# Patient Record
Sex: Male | Born: 1935 | Race: White | Hispanic: No | Smoking: Never smoker
Health system: Southern US, Community
[De-identification: ages and names within clinical notes are randomized; demographics above are authoritative.]

## PROBLEM LIST (undated history)

## (undated) DIAGNOSIS — I4510 Unspecified right bundle-branch block: Secondary | ICD-10-CM

## (undated) DIAGNOSIS — I2510 Atherosclerotic heart disease of native coronary artery without angina pectoris: Secondary | ICD-10-CM

## (undated) DIAGNOSIS — E785 Hyperlipidemia, unspecified: Secondary | ICD-10-CM

## (undated) DIAGNOSIS — Z950 Presence of cardiac pacemaker: Secondary | ICD-10-CM

## (undated) DIAGNOSIS — I495 Sick sinus syndrome: Secondary | ICD-10-CM

## (undated) DIAGNOSIS — I10 Essential (primary) hypertension: Secondary | ICD-10-CM

## (undated) HISTORY — DX: Unspecified right bundle-branch block: I45.10

## (undated) HISTORY — DX: Essential (primary) hypertension: I10

## (undated) HISTORY — DX: Sick sinus syndrome: I49.5

## (undated) HISTORY — PX: CATARACT EXTRACTION, BILATERAL: SHX1313

## (undated) HISTORY — DX: Hyperlipidemia, unspecified: E78.5

## (undated) HISTORY — DX: Atherosclerotic heart disease of native coronary artery without angina pectoris: I25.10

## (undated) HISTORY — PX: OTHER SURGICAL HISTORY: SHX169

---

## 1993-04-06 HISTORY — PX: HERNIA REPAIR: SHX51

## 1997-10-05 ENCOUNTER — Other Ambulatory Visit: Admission: RE | Admit: 1997-10-05 | Discharge: 1997-10-05 | Payer: Self-pay | Admitting: Cardiology

## 1997-12-22 ENCOUNTER — Observation Stay (HOSPITAL_COMMUNITY): Admission: EM | Admit: 1997-12-22 | Discharge: 1997-12-23 | Payer: Self-pay | Admitting: Emergency Medicine

## 2002-04-06 HISTORY — PX: HAND SURGERY: SHX662

## 2003-01-29 ENCOUNTER — Encounter: Admission: RE | Admit: 2003-01-29 | Discharge: 2003-01-29 | Payer: Self-pay | Admitting: Orthopedic Surgery

## 2003-01-29 ENCOUNTER — Encounter: Payer: Self-pay | Admitting: Orthopedic Surgery

## 2003-01-31 ENCOUNTER — Ambulatory Visit (HOSPITAL_BASED_OUTPATIENT_CLINIC_OR_DEPARTMENT_OTHER): Admission: RE | Admit: 2003-01-31 | Discharge: 2003-01-31 | Payer: Self-pay | Admitting: Orthopedic Surgery

## 2003-01-31 ENCOUNTER — Encounter (INDEPENDENT_AMBULATORY_CARE_PROVIDER_SITE_OTHER): Payer: Self-pay | Admitting: Specialist

## 2003-01-31 ENCOUNTER — Ambulatory Visit (HOSPITAL_COMMUNITY): Admission: RE | Admit: 2003-01-31 | Discharge: 2003-01-31 | Payer: Self-pay | Admitting: Orthopedic Surgery

## 2003-06-06 ENCOUNTER — Encounter (INDEPENDENT_AMBULATORY_CARE_PROVIDER_SITE_OTHER): Payer: Self-pay | Admitting: Specialist

## 2003-06-06 ENCOUNTER — Ambulatory Visit (HOSPITAL_COMMUNITY): Admission: RE | Admit: 2003-06-06 | Discharge: 2003-06-06 | Payer: Self-pay | Admitting: Gastroenterology

## 2003-12-04 ENCOUNTER — Encounter: Admission: RE | Admit: 2003-12-04 | Discharge: 2003-12-04 | Payer: Self-pay | Admitting: Orthopedic Surgery

## 2003-12-06 ENCOUNTER — Encounter (INDEPENDENT_AMBULATORY_CARE_PROVIDER_SITE_OTHER): Payer: Self-pay | Admitting: Specialist

## 2003-12-06 ENCOUNTER — Ambulatory Visit (HOSPITAL_COMMUNITY): Admission: RE | Admit: 2003-12-06 | Discharge: 2003-12-06 | Payer: Self-pay | Admitting: Orthopedic Surgery

## 2003-12-06 ENCOUNTER — Ambulatory Visit (HOSPITAL_BASED_OUTPATIENT_CLINIC_OR_DEPARTMENT_OTHER): Admission: RE | Admit: 2003-12-06 | Discharge: 2003-12-06 | Payer: Self-pay | Admitting: Orthopedic Surgery

## 2004-07-08 ENCOUNTER — Observation Stay (HOSPITAL_COMMUNITY): Admission: EM | Admit: 2004-07-08 | Discharge: 2004-07-09 | Payer: Self-pay | Admitting: Emergency Medicine
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
## 2006-06-23 ENCOUNTER — Inpatient Hospital Stay (HOSPITAL_COMMUNITY): Admission: AD | Admit: 2006-06-23 | Discharge: 2006-06-24 | Payer: Self-pay | Admitting: *Deleted

## 2006-06-23 HISTORY — PX: OTHER SURGICAL HISTORY: SHX169

## 2010-02-27 ENCOUNTER — Encounter: Payer: Self-pay | Admitting: Internal Medicine

## 2010-05-06 NOTE — Miscellaneous (Signed)
Summary: Device preload  Clinical Lists Changes  Observations: Added new observation of PPM INDICATN: Sick sinus syndrome (02/27/2010 11:34) Added new observation of MAGNET RTE: BOL 85 ERI 65 (02/27/2010 11:34) Added new observation of PPMLEADSTAT2: active (02/27/2010 11:34) Added new observation of PPMLEADSER2: [id number] (02/27/2010 11:34) Added new observation of PPMLEADMOD2: [id number]  (02/27/2010 11:34) Added new observation of PPMLEADDOI2: 06/23/2006  (02/27/2010 11:34) Added new observation of PPMLEADLOC2: RV  (02/27/2010 11:34) Added new observation of PPMLEADSTAT1: active  (02/27/2010 11:34) Added new observation of PPMLEADSER1: [id number]  (02/27/2010 11:34) Added new observation of PPMLEADMOD1: 5076  (02/27/2010 11:34) Added new observation of PPMLEADDOI1: 06/23/2006  (02/27/2010 11:34) Added new observation of PPMLEADLOC1: RA  (02/27/2010 11:34) Added new observation of PPM DOI: 06/23/2006  (02/27/2010 11:34) Added new observation of PPM SERL#: [id number]  (02/27/2010 11:34) Added new observation of PPM MODL#: [id number]  (02/27/2010 96:29) Added new observation of PACEMAKERMFG: Medtronic  (02/27/2010 11:34) Added new observation of PPM IMP MD: Charlynn Court  (02/27/2010 11:34) Added new observation of PPM REFER MD: Peter Swaziland, MD  (02/27/2010 11:34) Added new observation of PACEMAKER MD: Hillis Range, MD  (02/27/2010 11:34)      PPM Specifications Following MD:  Hillis Range, MD     Referring MD:  Peter Swaziland, MD PPM Vendor:  Medtronic     PPM Model Number:  ADDR01     PPM Serial Number:  [id number] PPM DOI:  06/23/2006     PPM Implanting MD:  Charlynn Court  Lead 1    Location: RA     DOI: 06/23/2006     Model #: [id number]     Serial #: [id number]     Status: active Lead 2    Location: RV     DOI: 06/23/2006     Model #: [id number]     Serial #: [id number]     Status: active  Magnet Response Rate:  BOL 85 ERI 65  Indications:  Sick sinus syndrome

## 2010-05-09 ENCOUNTER — Encounter: Payer: Self-pay | Admitting: Internal Medicine

## 2010-05-09 ENCOUNTER — Encounter (INDEPENDENT_AMBULATORY_CARE_PROVIDER_SITE_OTHER): Payer: Medicare Other | Admitting: Internal Medicine

## 2010-05-09 ENCOUNTER — Ambulatory Visit: Admit: 2010-05-09 | Payer: Self-pay | Admitting: Internal Medicine

## 2010-05-09 DIAGNOSIS — I495 Sick sinus syndrome: Secondary | ICD-10-CM

## 2010-05-09 DIAGNOSIS — I2510 Atherosclerotic heart disease of native coronary artery without angina pectoris: Secondary | ICD-10-CM | POA: Insufficient documentation

## 2010-05-09 DIAGNOSIS — I441 Atrioventricular block, second degree: Secondary | ICD-10-CM | POA: Insufficient documentation

## 2010-05-09 DIAGNOSIS — I10 Essential (primary) hypertension: Secondary | ICD-10-CM | POA: Insufficient documentation

## 2010-05-14 NOTE — Assessment & Plan Note (Signed)
Summary: pacer check/medtronic/gso card pt/jml   Vital Signs:  Patient profile:   77 year old male Height:      70 inches Weight:      184 pounds BMI:     26.50 Pulse rate:   64 / minute Pulse rhythm:   regular BP sitting:   136 / 78  (left arm) Cuff size:   regular  Vitals Entered By: Danielle Rankin, CMA (May 09, 2010 10:53 AM)  Visit Type:  Pacemaker check Referring Provider:  Dr Swaziland Primary Provider:  Jarome Matin  CC:  denies any cardiac complaints today.  History of Present Illness: Peter Reynolds is a pleasant 77 yo WM with a h/o intermittent mobitz II second degree AV block w/p PPM (MDT) by Dr Reyes Ivan 06/23/06 who presents today to establish care in the EP clinic.  He reports doing very well.  He remains active and walks one mile each day.  He denies symptoms of palpitations, chest pain, shortness of breath, orthopnea, PND, lower extremity edema, dizziness, presyncope, syncope, or neurologic sequela. The patient is tolerating medications without difficulties and is otherwise without complaint today.   Current Medications (verified): 1)  Vitamin E 400 Unit Caps (Vitamin E) .Marland Kitchen.. 1 Cap Once Daily 2)  Multivitamins   Tabs (Multiple Vitamin) .Marland Kitchen.. 1 Tab Once Daily 3)  Aspirin 81 Mg Tbec (Aspirin) .... Take One Tablet By Mouth Daily 4)  Lisinopril-Hydrochlorothiazide 20-12.5 Mg Tabs (Lisinopril-Hydrochlorothiazide) .Marland Kitchen.. 1 Tab Qam 5)  Diltiazem Hcl Cr 240 Mg Xr24h-Cap (Diltiazem Hcl) .Marland Kitchen.. 1 Cap Once Daily 6)  Lovastatin 40 Mg Tabs (Lovastatin) .Marland Kitchen.. 1 Tab At Bedtime 7)  Viagra 100 Mg Tabs (Sildenafil Citrate) .... 1/2 Tab As Needed 8)  Nitrostat 0.4 Mg Subl (Nitroglycerin) .Marland Kitchen.. 1 Tablet Under Tongue At Onset of Chest Pain; You May Repeat Every 5 Minutes For Up To 3 Doses.  Allergies (verified): No Known Drug Allergies  Past History:  Past Medical History:  1. History of Mobitz type II second degree AV block. s/ PPM  2. coronary artery disease. s/p CABG  3.  Hypertension.  4. Dyslipidemia. rheumatic fever at age 39  Past Surgical History:   Dual-chamber permanent pacemaker implant 2008   Coronary artery bypass grafting.  Excision of palmar fascia, right middle, right ring and right little fingers.  Family History: +CAD  Social History:   He denies smoking and drinking.  Lives in [place] and attends [employer]  Review of Systems       + poor hearing All systems are reviewed and negative except as listed in the HPI.   Physical Exam  General:  Well developed, well nourished, in no acute distress. Head:  normocephalic and atraumatic Eyes:  PERRLA/EOM intact; conjunctiva and lids normal. Mouth:  Teeth, gums and palate normal. Oral mucosa normal. Neck:  supple Chest Wall:  pacemaker pocket is well healed Lungs:  Clear bilaterally to auscultation and percussion. Heart:  Non-displaced PMI, chest non-tender; regular rate and rhythm, S1, S2 without murmurs, rubs or gallops. Carotid upstroke normal, no bruit. Normal abdominal aortic size, no bruits. Femorals normal pulses, no bruits. Pedals normal pulses. No edema, no varicosities. Abdomen:  Bowel sounds positive; abdomen soft and non-tender without masses, organomegaly, or hernias noted. No hepatosplenomegaly. Msk:  Back normal, normal gait. Muscle strength and tone normal. Extremities:  No clubbing or cyanosis. Neurologic:  Alert and oriented x 3. Skin:  Intact without lesions or rashes. Psych:  Normal affect.   Impression & Recommendations:  Problem # 1:  ATRIOVENTRICULAR BLOCK, 2ND DEGREE (ICD-426.13) normal pacemaker function as above  Problem # 2:  CAD (ICD-414.00) no symptoms of ischemia no changes today regular exercise encouraged  Problem # 3:  ESSENTIAL HYPERTENSION, BENIGN (ICD-401.1) stable salt restriction  Patient Instructions: 1)  Your physician wants you to follow-up in: 12 months with Dr Jacquiline Doe will receive a reminder letter in the  mail two months in advance. If you don't receive a letter, please call our office to schedule the follow-up appointment. 2)  [facility] transmission on 08/07/2010 3)  Your physician recommends that you continue on your current medications as directed. Please refer to the Current Medication list given to you today.      PPM Specifications Following MD:  Hillis Range, MD     Referring MD:  Peter Swaziland, MD PPM Vendor:  [employer]tronic     PPM Model Number:  ADDR01     PPM Serial Number:  [id number] H PPM DOI:  06/23/2006     PPM Implanting MD:  Charlynn Court  Lead 1    Location: RA     DOI: 06/23/2006     Model #: 5076     Serial #: [id number]     Status: active Lead 2    Location: RV     DOI: 06/23/2006     Model #: 8469     Serial #: [id number]     Status: active  Magnet Response Rate:  BOL 85 ERI 65  Indications:  Sick sinus syndrome   PPM Follow Up Battery Voltage:  2.78 V     Battery Est. Longevity:  6 yrs       PPM Device Measurements Atrium  Amplitude: 5.60 mV, Impedance: 421 ohms, Threshold: 0.50 V at 0.40 msec Right Ventricle  Amplitude: 15.68 mV, Impedance: 531 ohms, Threshold: 0.50 V at 0.40 msec  Episodes MS Episodes:  2     Percent Mode Switch:  <0.1%     Ventricular High Rate:  1     Atrial Pacing:  87.2%     Ventricular Pacing:  0.2%  Parameters Mode:  MVP     Lower Rate Limit:  60     Upper Rate Limit:  130 Paced AV Delay:  180     Sensed AV Delay:  150 Next Remote Date:  08/07/2010     Next Cardiology Appt Due:  05/08/2011 Tech Comments:  2 MODE SWITCHES--LESS THAN 1 MINUTE. 1 VHR EPISODE LASTING 6 BEATS. NORMAL DEVICE FUNCTION.  PT WALKS ABOUT 1 MILE PER 3-4 TIMES A WEEK.  PT FEELS GREAT W/ACTIVITY.  CHANGED RA OUTPUT FROM 1.5 TO 2.0 AND RV OUTPUT FROM 2.0 TO 2.5 V.  CARELINK 08-07-10 AND ROV IN 12 MTHS W/JA. Vella Kohler  May 09, 2010 11:15 AM MD Comments:  agree

## 2010-05-22 NOTE — Cardiovascular Report (Signed)
Summary: Office Visit   Office Visit   Imported By: Roderic Ovens 05/14/2010 10:15:17  _____________________________________________________________________  External Attachment:    Type:   Image     Comment:   External Document

## 2010-06-09 ENCOUNTER — Other Ambulatory Visit (HOSPITAL_BASED_OUTPATIENT_CLINIC_OR_DEPARTMENT_OTHER): Payer: Self-pay | Admitting: Internal Medicine

## 2010-06-11 ENCOUNTER — Ambulatory Visit
Admission: RE | Admit: 2010-06-11 | Discharge: 2010-06-11 | Disposition: A | Discharge status: Home / Self Care | Payer: Medicare Other | Source: Ambulatory Visit | Attending: Internal Medicine | Admitting: Internal Medicine

## 2010-08-14 ENCOUNTER — Encounter: Payer: Medicare Other | Admitting: *Deleted

## 2010-08-20 ENCOUNTER — Encounter: Payer: Self-pay | Admitting: *Deleted

## 2010-08-22 ENCOUNTER — Other Ambulatory Visit: Payer: Self-pay | Admitting: Internal Medicine

## 2010-08-22 ENCOUNTER — Ambulatory Visit (INDEPENDENT_AMBULATORY_CARE_PROVIDER_SITE_OTHER): Payer: Medicare Other | Admitting: *Deleted

## 2010-08-22 DIAGNOSIS — I441 Atrioventricular block, second degree: Secondary | ICD-10-CM
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
## 2010-08-22 NOTE — Op Note (Signed)
NAME:  Peter Reynolds, Peter Reynolds               ACCOUNT NO.:  [id number]   MEDICAL RECORD NO.:  [id number]                   PATIENT TYPE:  AMB   LOCATION:  ENDO                                 FACILITY:  [facility]   PHYSICIAN:  John C. Madilyn Fireman, M.D.                 DATE OF BIRTH:  1928/06/25   DATE OF PROCEDURE:  06/06/2003  DATE OF DISCHARGE:                                 OPERATIVE REPORT   PROCEDURE:  Colonoscopy.   INDICATIONS FOR PROCEDURE:  Average-risk colon cancer screening in a 77-year-  old patient with no prior screening.   DESCRIPTION OF PROCEDURE:  The patient was placed in the left lateral  decubitus position and placed on the pulse monitor with continuous low-flow  oxygen delivered by nasal cannula.  He was sedated with 50 mcg IV fentanyl  and 4 mg IV Versed.  The Olympus video colonoscope was inserted into the  rectum and advanced to the cecum, confirmed by transillumination of  McBurney's point and visualization of the ileocecal valve and appendiceal  orifice.  Prep was excellent.  Within the cecum, there was a 4 mm polyp that  was removed by hot biopsy.  In the ascending colon, a 6 mm polyp was  fulgurated by hot biopsy.  The remainder of the ascending as well as the  transverse, descending, and sigmoid colon appeared normal with no further  polyps, masses, diverticula, or other mucosal abnormalities.  Within the  distal rectum, there was a 1.2 cm pedunculated polyp about 5 cm from the  anal verge that was removed by snare and sent in a separate specimen  container.  The scope was then withdrawn and the patient returned to the  recovery room in stable condition.  He tolerated the procedure well and  there were no immediate complications.   IMPRESSION:  Cecal, ascending and rectal colon polyps.   PLAN:  Await histology to determine method and interval for future colon  screening.                                               John C. Madilyn Fireman, M.D.    JCH/MEDQ  D:   06/06/2003  T:  06/06/2003  Job:  [id number]   cc:   Peter M. Swaziland, M.D.  [address], [address]  [place], [place] [address]  Fax: [phone]

## 2010-08-22 NOTE — Op Note (Signed)
NAME:  Peter Reynolds, Peter Reynolds               ACCOUNT NO.:  [id number]   MEDICAL RECORD NO.:  [id number]                   PATIENT TYPE:  AMB   LOCATION:  DSC                                  FACILITY:  [facility]   PHYSICIAN:  Cindee Salt, M.D.                    DATE OF BIRTH:  08/24/1928   DATE OF PROCEDURE:  12/06/2003  DATE OF DISCHARGE:                                 OPERATIVE REPORT   PREOPERATIVE DIAGNOSIS:  Dupuytren's contracture, right middle, right ring  and right little fingers.   POSTOPERATIVE DIAGNOSIS:  Dupuytren's contracture, right middle, right ring  and right little fingers.   OPERATION PERFORMED:  Excision of palmar fascia, right middle, right ring  and right little fingers.   SURGEON:  Cindee Salt, M.D.   ASSISTANTCarolyne Fiscal.   ANESTHESIA:  General.   INDICATIONS FOR PROCEDURE:  The patient is a 77 year old male with a history  of Dupuytren's contracture.  He is admitted for excision.   DESCRIPTION OF PROCEDURE:  The patient was brought to the operating room  where a general anesthetic was carried out without difficulty.  He was  prepped using DuraPrep in supine position, right arm free.  The limb was  exsanguinated loosely with an Esmarch bandage.  Tourniquet placed high on  the arm was inflated to 250 mmHg.  This allowed blood vessels to remain  filled.  A volar Brunner incision was made primarily over the little finger,  dissecting over the space between the middle and ring to allow excision of  the palmar fascia cords to each of these.  With blunt and sharp dissection  this was carried down through subcutaneous tissue.  The palmar fascia was  lifted off the carpal retinaculum proximally.  This was then followed  distally.  Neurovascular structures were identified.  With blunt and sharp  dissection this was dissected free out to the level of the middle phalanx of  the little finger.  The entire specimen was sent to pathology.  A cord was  present from the  abductor digiti quinti.  This was excised and neurovascular  bundles were left intact.  The incision on the middle ring was then  deepened.  The cords to each of these two digits were then excised without  difficulty inserting on the base of the proximal phalanx.  Neurovascular  structures were protected on each.  The wound was then copiously irrigated  with saline.  Vessel loop drain placed in the depths of the wound and the  wound closed with interrupted 5-0 nylon sutures.  A sterile compressive  dressing and splint were applied.  On deflation of the tourniquet, all  fingers immediately pinked.  He was taken to the recovery room for  observation in satisfactory condition.  He is discharged to home to return  to the [facility] in one week on Vicodin.  Cindee Salt, M.D.    Angelique Blonder  D:  12/06/2003  T:  12/06/2003  Job:  [id number]

## 2010-08-22 NOTE — H&P (Signed)
NAME:  Peter Reynolds, Peter Reynolds NO.:  [id number]   MEDICAL RECORD NO.:  [id number]            PATIENT TYPE:   LOCATION:                                 FACILITY:   PHYSICIAN:  Elmore Guise., M.D.   DATE OF BIRTH:   DATE OF ADMISSION:  06/23/2006  DATE OF DISCHARGE:                              HISTORY & PHYSICAL   INDICATION FOR ADMISSION:  Second-degree AV block with new bundle branch  block with history of presyncope for pacemaker implant.   HISTORY OF PRESENT ILLNESS:  Peter Reynolds is a very pleasant 77-year-  old white male, past medical history of coronary artery disease,  hypertension, dyslipidemia, who is being referred for a pacemaker  implant.  The patient has had syncopal spells in the past, with his last  one being approximately 5 years ago.  Since that time he had been doing  okay, he would have occasional episodes of fatigue and palpitations.  He  presented for evaluation and stress testing today, and was found to have  Mobitz type 2 second-degree AV block with new right bundle branch.  He  is now referred to discuss pacemaker implant.  He denies any exertional  angina, no orthopnea, no PND, no recent fever or cough.  He seems to be  tolerating his current medications well.  Review of systems are  otherwise negative.   CURRENT MEDICATIONS:  1. Multivitamin once daily.  2. Aspirin 325 mg daily.  3. Lisinopril/hydrochlorothiazide 20/12.5 mg daily.  4. Lovastatin 40 mg daily.   ALLERGIES:  None.   FAMILY HISTORY:  Positive for heart disease and COPD.   SOCIAL HISTORY:  He is remarried for the last year and half.  No tobacco  and no alcohol.   PHYSICAL EXAMINATION:  His weight is 182 pounds, his blood pressure is  140/90, his heart rate is 56 with occasional irregularity.  GENERAL:  He is a very pleasant elderly white male, alert and oriented  x4, in no acute distress.  He does appear younger than stated age.  HEENT:  Normal.  NECK: Supple.  No  lymphadenopathy, 2+ carotids.  No JVD and no bruits.  LUNGS:  Clear.  HEART:  Regular with a normal S1,S2, 2/6 holosystolic murmur heard left  lower sternal border.  ABDOMEN:  Soft, nontender, nondistended.  EXTREMITIES:  Have no significant edema.   His blood work is pending at time of dictation.  His EKG was reviewed  and showed Mobitz type 2 2:1 AV block with heart rate in the low 40s  with right bundle branch block.   IMPRESSION:  1. History of syncope.  2. Now Mobitz type 2 second-degree AV block with new right bundle      branch block.  3. History of coronary artery disease and coronary artery bypass      grafting.   PLAN:  The patient will be admitted for pacemaker implant.  I have  discussed risks and benefits with him at length.  He will continue his  medications as listed.  He will have routine blood work done today.  The  patient does wish  to proceed with the procedure.      Elmore Guise., M.D.  Electronically Signed     TWK/MEDQ  D:  06/18/2006  T:  06/20/2006  Job:  [id number]
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
## 2010-08-22 NOTE — Op Note (Signed)
NAME:  Peter Reynolds, Peter Reynolds NO.:  [id number]   MEDICAL RECORD NO.:  [id number]                   PATIENT TYPE:  AMB   LOCATION:  DSC                                  FACILITY:  [facility]   PHYSICIAN:  Cindee Salt, M.D.                    DATE OF BIRTH:  [birth year]   DATE OF PROCEDURE:  01/31/2003  DATE OF DISCHARGE:                                 OPERATIVE REPORT   PREOPERATIVE DIAGNOSIS:  Dupuytren's' contracture left hand.   POSTOPERATIVE DIAGNOSIS:  Dupuytren's' contracture left hand.   PROCEDURE:  Excision of palmar fascia first web space, left middle, left  ring, and left little fingers.   SURGEON:  Cindee Salt, M.D.   ASSISTANTCarolyne Fiscal.   ANESTHESIA:  General.   INDICATIONS FOR PROCEDURE:  The patient is a 77 year old male with a history  of Dupuytren's contracture involving all but his index finger left hand.  He  is desirous of having this removed with flexion deformities present.   DESCRIPTION OF PROCEDURE:  The patient was brought to the operating room  where a general anesthesia was carried out without difficulty.  He was  prepped using Duraprep in the supine position with the left arm free.  The  limb was exsanguinated with an Esmarch bandage.  The tourniquet placed high  on the arm was inflated to 250 mmHg.  The first web space was addressed  initially.  A zigzag incision was made and carried down through the  subcutaneous tissue, the cord going to the thumb was excised protecting the  digital nerve.  The first web space cord was also removed protecting the  artery and nerve to the index finger.  The wound was irrigated and closed  with interrupted 5-0 nylon sutures.  A separate volar Brunner type incision  was made based primarily to the little finger with the greatest degree of  flexion contracture. Skin bridges were left and volar Brunner incisions were  made to the middle and ring.  Dissection was carried down through  subcutaneous  tissue.  Bleeders were electrocauterized.  Palmar fascia was  isolated over the carpal retinaculum.  This was transected proximally.  This  allowed it to be lifted distally.  With blunt and sharp dissection it was  dissected free, protecting the neurovascular bundles to each of the fingers.  The little finger cord proceeded out to the middle phalanx.  This was  removed in total protecting both radial and ulnar nerves.  There was any  significant contribution from the abductor Digi quinti.  The specimen was  sent to pathology.  A separate incision on the middle finger allowed removal  of the cord from proximally to distally again protecting neurovascular  bundles, it was inserted down to the proximal phalanx and was removed in  total and sent to pathology.  Both neurovascular bundles were intact.  The  ring finger was attended to  next, again this cord proceeded out to the  proximal phalanx, inserted at the A2 pulley.  With protection of the  neurovascular bundles, it was excised and also sent to pathology.  The  wounds were copiously irrigated with saline.  Vessel loop drains were placed  into each wound, brought out proximally.  The wounds were closed with  interrupted 5-0 nylon sutures.  A sterile  compressive dressing was applied.  The tourniquet deflated.  All fingers  immediately pink.  The splint was applied.  He was taken to the recovery  room for observation in satisfactory condition.  He is discharged home to  return to the [facility] in one week on Vicodin and Keflex.                                               Cindee Salt, M.D.    Angelique Blonder  D:  01/31/2003  T:  01/31/2003  Job:  [id number]

## 2010-08-22 NOTE — Discharge Summary (Signed)
NAME:  Peter Reynolds, Peter Reynolds NO.:  [id number]   MEDICAL RECORD NO.:  [id number]          PATIENT TYPE:  INP   LOCATION:  6525                         FACILITY:  [facility]   PHYSICIAN:  Elmore Guise., M.D.DATE OF BIRTH:  05-13-1928   DATE OF ADMISSION:  06/23/2006  DATE OF DISCHARGE:  06/24/2006                               DISCHARGE SUMMARY   DISCHARGE DIAGNOSES:  1. History of Mobitz type II second degree AV block.  2. Bradycardia.  3. Status post dual chamber permanent pacemaker implant.  4. History of coronary artery disease.  5. Hypertension.  6. Dyslipidemia.   HISTORY OF PRESENT ILLNESS:  Peter Reynolds is a very pleasant, 77-year-  old, white male who was found to have bradycardia and Mobitz type II  second degree AV block.  He was referred for pacemaker implant.   HOSPITAL COURSE:  The patient's hospital course was uncomplicated.  He  underwent dual chamber permanent pacemaker implant on June 23, 2006.  He had a Medtronic Adapta dual chamber pacemaker nd implanted.  His  postop course went smoothly.  On postop day #1, he did have a small  hematoma noted at the inferior portion of his pocket, however, no active  bleeding was noted.  The patient's chest x-ray showed normal position of  his atrial and ventricular leads.  He had no pneumothorax.  His  pacemaker was interrogated and functioned appropriately.  He had  thresholds of less than 0.5 volts in 0.5 ms with the atrial and  ventricular chambers.  His impedance and sensing were all stable.  He  will be discharged home today to continue the following medications:   1. Multivitamin once daily.  2. Aspirin once daily.  3. Lisinopril/HCTZ 20/12.5 mg daily.  4. Lovastatin 40 mg daily.  5. Tylenol Extra Strength 1-2 q.6 h. as needed for pain.   The patient was instructed to put cool compresses to his site 2 times  per day for the next 24 hours to limit any further swelling.  He was  also given an  instruction/restriction sheet regarding what he can and  what he cannot do secondary to his pacemaker implant.  He is not to pick  up any heavy items with his left arm for approximately 2 weeks or do any  strenuous activity with his left arm for 2 weeks.  He is to keep his  pacemaker site clean and dry, and Betadine his Steri-Strips daily for  the next 3 days.  He was notified to call the office should he have any  further problems.  He will follow up in 1 week for post hospital check  and wound check.      Elmore Guise., M.D.  Electronically Signed    TWK/MEDQ  D:  06/24/2006  T:  06/24/2006  Job:  [id number]

## 2010-08-22 NOTE — Op Note (Signed)
NAME:  Peter Reynolds, KISHI NO.:  [id number]   MEDICAL RECORD NO.:  [id number]          PATIENT TYPE:  INP   LOCATION:  2807                         FACILITY:  [facility]   PHYSICIAN:  Elmore Guise., M.D.DATE OF BIRTH:  1928-04-21   DATE OF PROCEDURE:  06/23/2006  DATE OF DISCHARGE:                               OPERATIVE REPORT   PROCEDURE:  Dual-chamber permanent pacemaker implant.   INDICATIONS FOR PROCEDURE:  Mobitz type II second-degree AV block. Known  history of coronary disease, with bradycardia.   HISTORY OF PRESENT ILLNESS:  [name] is a very pleasant 77-year-  old white male, past medical history of coronary artery disease and  hypertension, who presented for evaluation was found to have off-and-on  bradycardia with intermittent second-degree heart block (Mobitz type  II).  The patient is now referred for permanent pacemaker implant.   PROCEDURE DESCRIPTION:  The patient was brought to cardiac cath lab  after appropriate informed consent.  He was prepped and draped in  sterile fashion.  Approximately 20 cc of 1% lidocaine was used for local  anesthesia.  A 2-inches incision was made in the left deltopectoral  groove.  A subcutaneous pocket was then made with blunt and Bovie  dissection.  A venogram was performed showing the course of the left  axillary/subclavian vein.  The left axillary vein was then accessed in  two separate sticks under fluoroscopic guidance.  Two 7-French safety  sheaths were placed over the retained wires.  The ventricular lead was  then placed. The ventricular lead is a Medtronic active fixation, 5076-  58 cm, serial number [id number].  The following measurements were  obtained.  R waves measured 10 mV, with an impedance of 1437 ohms,  threshold of 0.4 V at 0.5 msec, with a current of 0.2  mA. 10-volt check  was negative.  The atrial lead was then placed. The atrial lead is a  Medtronic active fixation, 5076-52 cm, serial  number [id number].  The  following measurements were obtained.  P-waves measured 4.2 mV, with an  impedance of 632 ohms, threshold of 0.7 V at 0.5 milliseconds, current  of 0.9 mA.  10-volt check was negative.  The safety sheaths were peeled  from the leads.  Leads were then sewn into the pocket.  The pocket was  copiously irrigated with kanamycin solution.  The atrial and ventricular  leads were then identified and placed in the appropriate portions of a  Medtronic Adapta ADDRO1 generator, serial number [id number].  The  generator was sewn into the pocket.  The pocket was then closed with two  continuous layers of suture (2-0 Vicryl).  The patient tolerated the  procedure well and was transferred from the cardiac catheterization lab  in stable condition      Elmore Guise., M.D.  Electronically Signed     TWK/MEDQ  D:  06/23/2006  T:  06/23/2006  Job:  [id number]

## 2010-09-22 NOTE — Progress Notes (Signed)
Pacer remote check  

## 2010-10-10 ENCOUNTER — Encounter: Payer: Self-pay | Admitting: *Deleted

## 2010-11-20 ENCOUNTER — Encounter: Payer: Medicare Other | Admitting: *Deleted

## 2010-12-25 ENCOUNTER — Encounter: Payer: Self-pay | Admitting: Internal Medicine

## 2010-12-25 ENCOUNTER — Ambulatory Visit (INDEPENDENT_AMBULATORY_CARE_PROVIDER_SITE_OTHER): Payer: Medicare Other | Admitting: *Deleted

## 2010-12-25 ENCOUNTER — Other Ambulatory Visit: Payer: Self-pay

## 2010-12-25 DIAGNOSIS — I495 Sick sinus syndrome: Secondary | ICD-10-CM

## 2010-12-27 LAB — REMOTE PACEMAKER DEVICE
ATRIAL PACING PM: 86
BAMS-0001: 175 {beats}/min
RV LEAD THRESHOLD: 0.75 V

## 2011-01-02 NOTE — Progress Notes (Signed)
Pacer checked by remote 

## 2011-01-05 ENCOUNTER — Encounter: Payer: Self-pay | Admitting: *Deleted

## 2011-03-26 ENCOUNTER — Other Ambulatory Visit: Payer: Self-pay | Admitting: Internal Medicine

## 2011-03-26 ENCOUNTER — Ambulatory Visit (INDEPENDENT_AMBULATORY_CARE_PROVIDER_SITE_OTHER): Payer: Medicare Other | Admitting: *Deleted

## 2011-03-26 ENCOUNTER — Encounter: Payer: Self-pay | Admitting: Internal Medicine

## 2011-03-26 DIAGNOSIS — I441 Atrioventricular block, second degree: Secondary | ICD-10-CM

## 2011-04-02 LAB — REMOTE PACEMAKER DEVICE
AL THRESHOLD: 0.625 V
ATRIAL PACING PM: 86
BATTERY VOLTAGE: 2.77 V
RV LEAD AMPLITUDE: 22.4 mv
RV LEAD IMPEDENCE PM: 547 Ohm
VENTRICULAR PACING PM: 0

## 2011-04-03 NOTE — Progress Notes (Signed)
PPM remote 

## 2011-04-08 ENCOUNTER — Encounter: Payer: Self-pay | Admitting: *Deleted

## 2011-05-08 ENCOUNTER — Encounter: Payer: Self-pay | Admitting: *Deleted

## 2011-05-11 ENCOUNTER — Encounter: Payer: Self-pay | Admitting: Internal Medicine

## 2011-05-11 ENCOUNTER — Ambulatory Visit (INDEPENDENT_AMBULATORY_CARE_PROVIDER_SITE_OTHER): Payer: Medicare Other | Admitting: Internal Medicine

## 2011-05-11 VITALS — BP 136/70 | HR 78 | Resp 18 | Ht 70.0 in | Wt 180.1 lb

## 2011-05-11 DIAGNOSIS — I441 Atrioventricular block, second degree: Secondary | ICD-10-CM

## 2011-05-11 DIAGNOSIS — I2510 Atherosclerotic heart disease of native coronary artery without angina pectoris: Secondary | ICD-10-CM

## 2011-05-11 DIAGNOSIS — I10 Essential (primary) hypertension: Secondary | ICD-10-CM

## 2011-05-11 DIAGNOSIS — I4891 Unspecified atrial fibrillation: Secondary | ICD-10-CM

## 2011-05-11 MED ORDER — DILTIAZEM HCL ER COATED BEADS 360 MG PO CP24: 360.0000 mg | ORAL_CAPSULE | Freq: Every day | ORAL | Status: DC

## 2011-05-11 NOTE — Progress Notes (Signed)
PCP:  Dr Dossie Arbour Primary Cardiologist:  Dr [place]  The patient presents today for routine electrophysiology followup.  Since last being seen in our clinic, the patient reports doing very well. He remains active.  Today, he denies symptoms of palpitations, chest pain, shortness of breath, orthopnea, PND, lower extremity edema, dizziness, presyncope, syncope, or neurologic sequela.  The patient feels that he is tolerating medications without difficulties and is otherwise without complaint today.   Past Medical History  Diagnosis Date  . Essential hypertension, benign   . CAD     s/p CABG  . ATRIOVENTRICULAR BLOCK, 2ND DEGREE     s/p PPM (MDT) by Dr Reyes Ivan 2008  . Dyslipidemia    Past Surgical History  Procedure Date  . Dual-chamber permanent pacemaker implant 2008     MDT implanted by Dr Reyes Ivan  . Coronary artery bypass grafting.     Current Outpatient Prescriptions  Medication Sig Dispense Refill  . lisinopril-hydrochlorothiazide (PRINZIDE,ZESTORETIC) 20-12.5 MG per tablet Take 1 tablet by mouth daily.       . metFORMIN (GLUCOPHAGE-XR) 500 MG 24 hr tablet Take 500 mg by mouth daily with supper.       . pravastatin (PRAVACHOL) 40 MG tablet Take 40 mg by mouth daily.         No Known Allergies  History   Social History  . Marital Status: Single    Spouse Name: N/A    Number of Children: N/A  . Years of Education: N/A   Occupational History  . Not on file.   Social History Main Topics  . Smoking status: Never Smoker   . Smokeless tobacco: Not on file  . Alcohol Use: No  . Drug Use: No  . Sexually Active: Not on file   Other Topics Concern  . Not on file   Social History Narrative    Lives in [place] and attends [employer]    ROS-  All systems are reviewed and are negative except as outlined in the HPI above   Physical Exam: Filed Vitals:   05/11/11 1059  BP: 136/70  Pulse: 78  Resp: 18  Height: 5\' 10"  (1.778 m)  Weight: 180 lb  1.9 oz (81.702 kg)    GEN- The patient is well appearing, alert and oriented x 3 today.   Head- normocephalic, atraumatic Eyes-  Sclera clear, conjunctiva pink Ears- hearing intact Oropharynx- clear Neck- supple, no JVP Lymph- no cervical lymphadenopathy Lungs- Clear to ausculation bilaterally, normal work of breathing Chest- pacemaker pocket is well healed Heart- Regular rate and rhythm, no murmurs, rubs or gallops, PMI not laterally displaced GI- soft, NT, ND, + BS Extremities- no clubbing, cyanosis, or edema  Pacemaker interrogation- reviewed in detail today,  See PACEART report  Assessment and Plan:

## 2011-05-11 NOTE — Assessment & Plan Note (Signed)
Stable No change required today  

## 2011-05-11 NOTE — Assessment & Plan Note (Signed)
Normal pacemaker function See Pace Art report No changes today  

## 2011-05-11 NOTE — Patient Instructions (Signed)
Your physician has recommended you make the following change in your medication:  1) Increase cardizem (diltiazem) to 360 mg one capsule by mouth daily.  Remote monitoring is used to monitor your Pacemaker of ICD from home. This monitoring reduces the number of office visits required to check your device to one time per year. It allows [place] to keep an eye on the functioning of your device to ensure it is working properly. You are scheduled for a device check from home on 08/13/11. You may send your transmission at any time that day. If you have a wireless device, the transmission will be sent automatically. After your physician reviews your transmission, you will receive a postcard with your next transmission date.  Your physician wants you to follow-up in: 6 months with Dr. Swaziland & 1 year with Dr. Johney Frame. You will receive a reminder letter in the mail two months in advance. If you don't receive a letter, please call our office to schedule the follow-up appointment.

## 2011-05-11 NOTE — Assessment & Plan Note (Signed)
Pacemaker interrogation today reveals 2 short episodes of afib (longest 8 minutes) since last year.  V rates are very fast with afib though pt is asymptomatic. I will increase diltiazem to 360mg  daily today. As afib burden increases, he will require an antiarrhythmic medication (CHADS2 score is at least 2).

## 2011-06-08 ENCOUNTER — Encounter: Payer: Self-pay | Admitting: Cardiology

## 2011-06-10 ENCOUNTER — Encounter: Payer: Self-pay | Admitting: Cardiology

## 2011-06-29 ENCOUNTER — Telehealth: Payer: Self-pay | Admitting: Cardiology

## 2011-06-29 NOTE — Telephone Encounter (Signed)
Follow up from previous call:  Please call patient call on cell phone

## 2011-06-29 NOTE — Telephone Encounter (Signed)
Pt had a episode of getting hot and very tired and blurry while singing at church last night

## 2011-06-29 NOTE — Telephone Encounter (Signed)
Patient called, stated he had episode last night at church after he had been singing for 1 hour felt hot and  had blurred vision.No chest pain,no fast heart beat,no other symptoms.States after he sat down he felt better.States feels good today.Appointment scheduled with Dr.Jordan 08/07/11.Advised to call back if needed.

## 2011-08-07 ENCOUNTER — Ambulatory Visit (INDEPENDENT_AMBULATORY_CARE_PROVIDER_SITE_OTHER): Payer: Medicare Other | Admitting: Cardiology

## 2011-08-07 ENCOUNTER — Encounter: Payer: Self-pay | Admitting: Cardiology

## 2011-08-07 VITALS — BP 148/79 | HR 64 | Ht 70.0 in | Wt 178.0 lb

## 2011-08-07 DIAGNOSIS — I4430 Unspecified atrioventricular block: Secondary | ICD-10-CM

## 2011-08-07 DIAGNOSIS — I4891 Unspecified atrial fibrillation: Secondary | ICD-10-CM

## 2011-08-07 DIAGNOSIS — I10 Essential (primary) hypertension: Secondary | ICD-10-CM

## 2011-08-07 DIAGNOSIS — I2510 Atherosclerotic heart disease of native coronary artery without angina pectoris: Secondary | ICD-10-CM

## 2011-08-07 DIAGNOSIS — E785 Hyperlipidemia, unspecified: Secondary | ICD-10-CM

## 2011-08-07 NOTE — Assessment & Plan Note (Signed)
Requested a copy of his most recent laboratory data.

## 2011-08-07 NOTE — Progress Notes (Signed)
   Elian Luvenia Heller Date of Birth: March 22, 1929 Medical Record #[id number]  History of Present Illness: Mr. Peddie is seen for yearly followup. He is an 78 year old white male with a known history of coronary disease. He underwent coronary bypass surgery in 1996. His last nuclear stress test in March of 2002 and was normal. He is also status post DDD pacemaker implant in March of 2008 4 AV block. He is feeling very well. He has no symptoms of chest pain, shortness of breath, or palpitations. He's had no edema. He is followed in our pacemaker clinic. He has lost 2 pounds since his last visit in February. He remains very active.  Current Outpatient Prescriptions on File Prior to Visit  Medication Sig Dispense Refill  . diltiazem (CARDIZEM CD) 360 MG 24 hr capsule Take 1 capsule (360 mg total) by mouth daily.  90 capsule  3  . lisinopril-hydrochlorothiazide (PRINZIDE,ZESTORETIC) 20-12.5 MG per tablet Take 1 tablet by mouth daily.       . metFORMIN (GLUCOPHAGE-XR) 500 MG 24 hr tablet Take 500 mg by mouth daily with supper.       . pravastatin (PRAVACHOL) 40 MG tablet Take 40 mg by mouth at bedtime.         No Known Allergies  Past Medical History  Diagnosis Date  . Essential hypertension, benign   . CAD     s/p CABG  . ATRIOVENTRICULAR BLOCK, 2ND DEGREE     s/p PPM (MDT) by Dr Reyes Ivan 2008  . Dyslipidemia   . Hematuria   . Diabetes mellitus     Past Surgical History  Procedure Date  . Dual-chamber permanent pacemaker implant 2008     MDT implanted by Dr Reyes Ivan  . Coronary artery bypass grafting.     lima-lad,svg-OM1-2,svg-rca  . Hernia repair 1995    right  . Hand surgery 2004    History  Smoking status  . Never Smoker   Smokeless tobacco  . Not on file    History  Alcohol Use No    Family History  Problem Relation Age of Onset  . Hypertension Mother   . Heart disease Father   . Heart disease Brother     Review of Systems: The review of systems is  positive for recent hematuria. CT scan showed normal kidneys. He is scheduled for cystoscopy with Dr. Annabell Howells. All other systems were reviewed and are negative.  Physical Exam: BP 148/79  Pulse 64  Ht 5\' 10"  (1.778 m)  Wt 178 lb (80.74 kg)  BMI 25.54 kg/m2 He is a pleasant white male who appears younger than his stated age. His HEENT exam is unremarkable. He has no JVD or bruits. There is no thyromegaly or adenopathy. Lungs are clear. Cardiac exam reveals a regular rate and rhythm without gallop, murmur, or click. Abdomen is soft and nontender. He has no masses or bruits. Extremities are without edema. Pedal pulses are 2+. Skin is warm and dry. He is alert and oriented x3. Cranial nerves II through XII are intact.  LABORATORY DATA: ECG today demonstrates atrial pacing at a rate of 64 beats per minute with a right bundle branch block.  Assessment / Plan:

## 2011-08-07 NOTE — Assessment & Plan Note (Addendum)
He was noted to have 2 brief episodes of atrial fibrillation on his last pacemaker check lasting less than 8 minutes. These were asymptomatic. We will continue to follow his pacemaker parameters. If he has increasing episodes of this arrhythmia he may need anticoagulation, but evaluation of his hematuria will sway this decision.

## 2011-08-07 NOTE — Patient Instructions (Signed)
Continue your medical therapy.  We will get a copy of your lab work from Dr. Eloise Harman.  I will see you again in 1 year.

## 2011-08-07 NOTE — Assessment & Plan Note (Signed)
He remains asymptomatic. We will continue with his risk factor modification. He is on aspirin and statin therapy. I'll followup again in one year.

## 2011-08-13 ENCOUNTER — Encounter: Payer: Medicare Other | Admitting: *Deleted

## 2011-08-21 ENCOUNTER — Encounter: Payer: Self-pay | Admitting: *Deleted

## 2011-08-27 ENCOUNTER — Ambulatory Visit (INDEPENDENT_AMBULATORY_CARE_PROVIDER_SITE_OTHER): Payer: Medicare Other | Admitting: *Deleted

## 2011-08-27 ENCOUNTER — Encounter: Payer: Self-pay | Admitting: Internal Medicine

## 2011-08-27 DIAGNOSIS — I441 Atrioventricular block, second degree: Secondary | ICD-10-CM

## 2011-08-28 LAB — REMOTE PACEMAKER DEVICE
AL THRESHOLD: 0.625 V
BAMS-0001: 175 {beats}/min
BATTERY VOLTAGE: 2.76 V
RV LEAD AMPLITUDE: 22.4 mv
RV LEAD THRESHOLD: 0.625 V

## 2011-09-18 ENCOUNTER — Encounter: Payer: Self-pay | Admitting: *Deleted

## 2011-12-03 ENCOUNTER — Ambulatory Visit (INDEPENDENT_AMBULATORY_CARE_PROVIDER_SITE_OTHER): Payer: Medicare Other | Admitting: *Deleted

## 2011-12-03 ENCOUNTER — Encounter: Payer: Self-pay | Admitting: Internal Medicine

## 2011-12-03 DIAGNOSIS — I441 Atrioventricular block, second degree: Secondary | ICD-10-CM

## 2011-12-04 LAB — REMOTE PACEMAKER DEVICE
AL THRESHOLD: 0.625 V
BAMS-0001: 175 {beats}/min
BATTERY VOLTAGE: 2.76 V
RV LEAD AMPLITUDE: 22.4 mv
RV LEAD THRESHOLD: 1.5 V
VENTRICULAR PACING PM: 0

## 2011-12-08 ENCOUNTER — Encounter: Payer: Self-pay | Admitting: *Deleted

## 2011-12-17 ENCOUNTER — Other Ambulatory Visit: Payer: Self-pay | Admitting: Gastroenterology

## 2011-12-24 ENCOUNTER — Other Ambulatory Visit: Payer: Self-pay | Admitting: Dermatology

## 2012-03-07 ENCOUNTER — Ambulatory Visit (INDEPENDENT_AMBULATORY_CARE_PROVIDER_SITE_OTHER): Payer: Medicare Other | Admitting: *Deleted

## 2012-03-07 ENCOUNTER — Encounter: Payer: Self-pay | Admitting: Internal Medicine

## 2012-03-07 DIAGNOSIS — I441 Atrioventricular block, second degree: Secondary | ICD-10-CM

## 2012-03-14 LAB — REMOTE PACEMAKER DEVICE
AL THRESHOLD: 0.625 V
RV LEAD AMPLITUDE: 16 mv
VENTRICULAR PACING PM: 0

## 2012-03-16 ENCOUNTER — Encounter: Payer: Self-pay | Admitting: *Deleted

## 2012-05-24 ENCOUNTER — Other Ambulatory Visit: Payer: Self-pay | Admitting: Emergency Medicine

## 2012-05-24 DIAGNOSIS — I4891 Unspecified atrial fibrillation: Secondary | ICD-10-CM

## 2012-05-24 MED ORDER — DILTIAZEM HCL ER COATED BEADS 360 MG PO CP24: 360.0000 mg | ORAL_CAPSULE | Freq: Every day | ORAL | Status: DC

## 2012-06-02 ENCOUNTER — Encounter: Payer: Self-pay | Admitting: Internal Medicine

## 2012-06-02 ENCOUNTER — Ambulatory Visit (INDEPENDENT_AMBULATORY_CARE_PROVIDER_SITE_OTHER): Payer: Medicare Other | Admitting: Internal Medicine

## 2012-06-02 ENCOUNTER — Ambulatory Visit (INDEPENDENT_AMBULATORY_CARE_PROVIDER_SITE_OTHER)
Admission: RE | Admit: 2012-06-02 | Discharge: 2012-06-02 | Disposition: A | Discharge status: Home / Self Care | Payer: Medicare PPO | Source: Ambulatory Visit | Attending: Internal Medicine | Admitting: Internal Medicine

## 2012-06-02 VITALS — BP 116/64 | HR 86 | Ht 70.0 in | Wt 175.8 lb

## 2012-06-02 DIAGNOSIS — T82190A Other mechanical complication of cardiac electrode, initial encounter: Secondary | ICD-10-CM

## 2012-06-02 DIAGNOSIS — I4891 Unspecified atrial fibrillation: Secondary | ICD-10-CM

## 2012-06-02 DIAGNOSIS — I10 Essential (primary) hypertension: Secondary | ICD-10-CM

## 2012-06-02 DIAGNOSIS — I441 Atrioventricular block, second degree: Secondary | ICD-10-CM

## 2012-06-02 LAB — PACEMAKER DEVICE OBSERVATION
AL AMPLITUDE: 5.6 mv
AL THRESHOLD: 0.5 V
ATRIAL PACING PM: 88
BAMS-0001: 175 {beats}/min
BATTERY VOLTAGE: 2.76 V
RV LEAD THRESHOLD: 0.75 V
VENTRICULAR PACING PM: 0

## 2012-06-02 NOTE — Progress Notes (Signed)
PCP:  Dr Dossie Arbour Primary Cardiologist:  Dr [place]  The patient presents today for routine electrophysiology followup.  Since last being seen in our clinic, the patient reports doing very well. He remains active for his age.  Today, he denies symptoms of palpitations, chest pain, shortness of breath, orthopnea, PND, lower extremity edema, dizziness, presyncope, syncope, or neurologic sequela.  The patient feels that he is tolerating medications without difficulties and is otherwise without complaint today.   Past Medical History  Diagnosis Date  . Essential hypertension, benign   . CAD     s/p CABG  . ATRIOVENTRICULAR BLOCK, 2ND DEGREE     s/p PPM (MDT) by Dr Reyes Ivan 2008  . Dyslipidemia   . Hematuria   . Diabetes mellitus   . RBBB    Past Surgical History  Procedure Laterality Date  . Dual-chamber permanent pacemaker implant 2008      MDT implanted by Dr Reyes Ivan  . Coronary artery bypass grafting.      lima-lad,svg-OM1-2,svg-rca  . Hernia repair  1995    right  . Hand surgery  2004    Current Outpatient Prescriptions  Medication Sig Dispense Refill  . aspirin 81 MG tablet Take 81 mg by mouth daily.      Marland Kitchen diltiazem (CARDIZEM CD) 360 MG 24 hr capsule Take 1 capsule (360 mg total) by mouth daily.  90 capsule  3  . lisinopril-hydrochlorothiazide (PRINZIDE,ZESTORETIC) 20-12.5 MG per tablet Take 1 tablet by mouth daily.       . metFORMIN (GLUCOPHAGE-XR) 500 MG 24 hr tablet Take 500 mg by mouth daily with supper.       . Multiple Vitamin (MULTI-VITAMIN PO) Take by mouth daily. Centrum-Silver      . pravastatin (PRAVACHOL) 40 MG tablet Take 40 mg by mouth at bedtime.       Marland Kitchen VITAMIN E PO Take by mouth daily.       No current facility-administered medications for this visit.    No Known Allergies  History   Social History  . Marital Status: Widowed    Spouse Name: N/A    Number of Children: 2  . Years of Education: N/A   Occupational History  . teacher     retired    Social History Main Topics  . Smoking status: Never Smoker   . Smokeless tobacco: Not on file  . Alcohol Use: No  . Drug Use: No  . Sexually Active: Not on file   Other Topics Concern  . Not on file   Social History Narrative    Lives in [place] and attends [employer]   Physical Exam: Filed Vitals:   06/02/12 0846  BP: 116/64  Pulse: 86  Height: 5\' 10"  (1.778 m)  Weight: 175 lb 12.8 oz (79.742 kg)    GEN- The patient is well appearing, alert and oriented x 3 today.   Head- normocephalic, atraumatic Eyes-  Sclera clear, conjunctiva pink Ears- hearing intact Oropharynx- clear Neck- supple, no JVP Lymph- no cervical lymphadenopathy Lungs- Clear to ausculation bilaterally, normal work of breathing Chest- pacemaker pocket is well healed Heart- Regular rate and rhythm, no murmurs, rubs or gallops, PMI not laterally displaced GI- soft, NT, ND, + BS Extremities- no clubbing, cyanosis, or edema  Pacemaker interrogation- reviewed in detail today,  See PACEART report  Assessment and Plan:

## 2012-06-02 NOTE — Patient Instructions (Signed)
Your physician wants you to follow-up in: 12 months with Dr Jacquiline Doe will receive a reminder letter in the mail two months in advance. If you don't receive a letter, please call our office to schedule the follow-up appointment.  Remote monitoring is used to monitor your Pacemaker of ICD from home. This monitoring reduces the number of office visits required to check your device to one time per year. It allows [place] to keep an eye on the functioning of your device to ensure it is working properly. You are scheduled for a device check from home on 08/30/12. You may send your transmission at any time that day. If you have a wireless device, the transmission will be sent automatically. After your physician reviews your transmission, you will receive a postcard with your next transmission date.  A chest x-ray takes a picture of the organs and structures inside the chest, including the heart, lungs, and blood vessels. This test can show several things, including, whether the heart is enlarges; whether fluid is building up in the lungs; and whether pacemaker / defibrillator leads are still in place.

## 2012-06-02 NOTE — Assessment & Plan Note (Signed)
Stable No change required today  

## 2012-06-02 NOTE — Assessment & Plan Note (Signed)
He is not pacemaker dependant. Device interrogation reveals that this past summer, his V lead impedance increased abruptly with concomitant increase in RV threshold.  His unipolar pacing/ sensing and impedance remain stable. He is not device dependant and is clear that he does not want lead revision at this time.  Given his advanced age, I think that this is reasonable. I will therefore keep his RV pace/ sense programmed unipolar and follow

## 2012-08-30 ENCOUNTER — Encounter: Payer: Medicare PPO | Admitting: *Deleted

## 2012-09-05 ENCOUNTER — Encounter: Payer: Self-pay | Admitting: *Deleted

## 2012-09-12 ENCOUNTER — Encounter: Payer: Self-pay | Admitting: Internal Medicine

## 2012-09-12 ENCOUNTER — Ambulatory Visit (INDEPENDENT_AMBULATORY_CARE_PROVIDER_SITE_OTHER): Payer: Medicare PPO | Admitting: *Deleted

## 2012-09-12 DIAGNOSIS — I441 Atrioventricular block, second degree: Secondary | ICD-10-CM

## 2012-09-12 DIAGNOSIS — Z950 Presence of cardiac pacemaker: Secondary | ICD-10-CM

## 2012-09-21 LAB — REMOTE PACEMAKER DEVICE
BAMS-0001: 175 {beats}/min
BATTERY VOLTAGE: 2.75 V
RV LEAD THRESHOLD: 0.625 V
VENTRICULAR PACING PM: 0

## 2012-10-11 ENCOUNTER — Encounter: Payer: Self-pay | Admitting: *Deleted

## 2012-12-19 ENCOUNTER — Encounter: Payer: Medicare PPO | Admitting: *Deleted

## 2012-12-26 ENCOUNTER — Encounter: Payer: Self-pay | Admitting: *Deleted

## 2013-01-04 ENCOUNTER — Ambulatory Visit (INDEPENDENT_AMBULATORY_CARE_PROVIDER_SITE_OTHER): Payer: Medicare PPO

## 2013-01-04 DIAGNOSIS — I441 Atrioventricular block, second degree: Secondary | ICD-10-CM

## 2013-01-11 LAB — REMOTE PACEMAKER DEVICE
AL IMPEDENCE PM: 411 Ohm
AL THRESHOLD: 0.75 V
ATRIAL PACING PM: 89
BAMS-0001: 175 {beats}/min

## 2013-01-25 ENCOUNTER — Encounter: Payer: Self-pay | Admitting: *Deleted

## 2013-02-10 ENCOUNTER — Encounter: Payer: Self-pay | Admitting: Internal Medicine

## 2013-04-10 ENCOUNTER — Ambulatory Visit: Payer: Medicare PPO | Admitting: *Deleted

## 2013-04-17 ENCOUNTER — Encounter: Payer: Self-pay | Admitting: Internal Medicine

## 2013-04-17 ENCOUNTER — Encounter: Payer: Medicare PPO | Admitting: *Deleted

## 2013-04-17 DIAGNOSIS — I4891 Unspecified atrial fibrillation: Secondary | ICD-10-CM

## 2013-04-17 DIAGNOSIS — I441 Atrioventricular block, second degree: Secondary | ICD-10-CM

## 2013-04-17 LAB — MDC_IDC_ENUM_SESS_TYPE_REMOTE
Battery Remaining Longevity: 30 mo
Battery Voltage: 2.75 V
Brady Statistic AP VP Percent: 0 %
Brady Statistic AS VP Percent: 0 %
Brady Statistic AS VS Percent: 11 %
Lead Channel Pacing Threshold Amplitude: 0.625 V
Lead Channel Pacing Threshold Amplitude: 0.75 V
Lead Channel Pacing Threshold Pulse Width: 0.4 ms
Lead Channel Pacing Threshold Pulse Width: 0.4 ms
Lead Channel Sensing Intrinsic Amplitude: 5.6 mV
Lead Channel Setting Pacing Amplitude: 2.5 V
Lead Channel Setting Pacing Pulse Width: 0.4 ms
MDC IDC MSMT BATTERY IMPEDANCE: 1630 Ohm
MDC IDC MSMT LEADCHNL RA IMPEDANCE VALUE: 417 Ohm
MDC IDC MSMT LEADCHNL RV IMPEDANCE VALUE: 613 Ohm
MDC IDC SESS DTM: 20150110222116
MDC IDC SET LEADCHNL RA PACING AMPLITUDE: 2 V
MDC IDC SET LEADCHNL RV SENSING SENSITIVITY: 2.8 mV
MDC IDC STAT BRADY AP VS PERCENT: 89 %

## 2013-05-01 ENCOUNTER — Encounter: Payer: Self-pay | Admitting: *Deleted

## 2013-05-24 ENCOUNTER — Encounter: Payer: Self-pay | Admitting: Internal Medicine

## 2013-05-24 ENCOUNTER — Ambulatory Visit (INDEPENDENT_AMBULATORY_CARE_PROVIDER_SITE_OTHER): Payer: Medicare PPO | Admitting: Internal Medicine

## 2013-05-24 VITALS — BP 138/80 | HR 85 | Ht 70.0 in | Wt 179.0 lb

## 2013-05-24 DIAGNOSIS — I495 Sick sinus syndrome: Secondary | ICD-10-CM | POA: Insufficient documentation

## 2013-05-24 DIAGNOSIS — I10 Essential (primary) hypertension: Secondary | ICD-10-CM

## 2013-05-24 DIAGNOSIS — I2510 Atherosclerotic heart disease of native coronary artery without angina pectoris: Secondary | ICD-10-CM

## 2013-05-24 LAB — MDC_IDC_ENUM_SESS_TYPE_INCLINIC
Battery Voltage: 2.74 V
Brady Statistic AS VP Percent: 0 %
Lead Channel Pacing Threshold Amplitude: 0.75 V
Lead Channel Sensing Intrinsic Amplitude: 8 mV
Lead Channel Setting Pacing Amplitude: 2 V
Lead Channel Setting Pacing Amplitude: 2.5 V
Lead Channel Setting Pacing Pulse Width: 0.4 ms
Lead Channel Setting Sensing Sensitivity: 2.8 mV
MDC IDC MSMT BATTERY IMPEDANCE: 1689 Ohm
MDC IDC MSMT BATTERY REMAINING LONGEVITY: 29 mo
MDC IDC MSMT LEADCHNL RA IMPEDANCE VALUE: 411 Ohm
MDC IDC MSMT LEADCHNL RA PACING THRESHOLD PULSEWIDTH: 0.4 ms
MDC IDC MSMT LEADCHNL RA SENSING INTR AMPL: 4 mV
MDC IDC MSMT LEADCHNL RV IMPEDANCE VALUE: 490 Ohm
MDC IDC MSMT LEADCHNL RV PACING THRESHOLD AMPLITUDE: 0.75 V
MDC IDC MSMT LEADCHNL RV PACING THRESHOLD PULSEWIDTH: 0.4 ms
MDC IDC SESS DTM: 20150218142732
MDC IDC STAT BRADY AP VP PERCENT: 0 %
MDC IDC STAT BRADY AP VS PERCENT: 89 %
MDC IDC STAT BRADY AS VS PERCENT: 11 %

## 2013-05-24 NOTE — Progress Notes (Signed)
PCP:  Dr Dossie Arbouran Paterson Primary Cardiologist:  Dr SwazilandJordan  The patient presents today for routine electrophysiology followup.  Since last being seen in our clinic, the patient reports doing very well. He remains active for his age.  Today, he denies symptoms of palpitations, chest pain, shortness of breath, orthopnea, PND, lower extremity edema, dizziness, presyncope, syncope, or neurologic sequela.  The patient feels that he is tolerating medications without difficulties and is otherwise without complaint today.   Past Medical History  Diagnosis Date  . Essential hypertension, benign   . CAD     s/p CABG  . ATRIOVENTRICULAR BLOCK, 2ND DEGREE     s/p PPM (MDT) by Dr Reyes IvanKersey 2008  . Dyslipidemia   . Hematuria   . Diabetes mellitus   . RBBB    Past Surgical History  Procedure Laterality Date  . Dual-chamber permanent pacemaker implant 2008      MDT implanted by Dr Reyes IvanKersey  . Coronary artery bypass grafting.      lima-lad,svg-OM1-2,svg-rca  . Hernia repair  1995    right  . Hand surgery  2004    Current Outpatient Prescriptions  Medication Sig Dispense Refill  . aspirin 81 MG tablet Take 81 mg by mouth daily.      Marland Kitchen. diltiazem (CARDIZEM CD) 360 MG 24 hr capsule Take 1 capsule (360 mg total) by mouth daily.  90 capsule  3  . lisinopril-hydrochlorothiazide (PRINZIDE,ZESTORETIC) 20-12.5 MG per tablet Take 1 tablet by mouth daily.       . metFORMIN (GLUCOPHAGE-XR) 500 MG 24 hr tablet Take 500 mg by mouth daily with supper.       . Multiple Vitamin (MULTI-VITAMIN PO) Take 1 tablet by mouth daily.       . pravastatin (PRAVACHOL) 40 MG tablet Take 40 mg by mouth at bedtime.       Marland Kitchen. VITAMIN E PO Take 400 Units by mouth daily.        No current facility-administered medications for this visit.    No Known Allergies  History   Social History  . Marital Status: Widowed    Spouse Name: N/A    Number of Children: 2  . Years of Education: N/A   Occupational History  . teacher    retired   Social History Main Topics  . Smoking status: Never Smoker   . Smokeless tobacco: Not on file  . Alcohol Use: No  . Drug Use: No  . Sexual Activity: Not on file   Other Topics Concern  . Not on file   Social History Narrative    [place] in [place] and attends [employer]   Physical Exam: Filed Vitals:   05/24/13 1110  BP: 138/80  Pulse: 85  Height: 5\' 10"  (1.778 m)  Weight: 179 lb (81.194 kg)    GEN- The patient is well appearing, alert and oriented x 3 today.   Head- normocephalic, atraumatic Eyes-  Sclera clear, conjunctiva pink Ears- hearing intact Oropharynx- clear Neck- supple, no JVP Lymph- no cervical lymphadenopathy Lungs- Clear to ausculation bilaterally, normal work of breathing Chest- pacemaker pocket is well healed Heart- Regular rate and rhythm, no murmurs, rubs or gallops, PMI not laterally displaced GI- soft, NT, ND, + BS Extremities- no clubbing, cyanosis, or edema  Pacemaker interrogation- reviewed in detail today,  See PACEART report  Assessment and Plan:  1. Sick sinus syndrome Normal pacemaker function See Pace Art report No changes today RV lead impedance/ threshold bipolar were previously noted  to be elevated.  Last year I reprogrammed unipolar for V pacing and he has done very well.  He does not pace in the ventricule.  2. Dizziness At times he has postural dizziness, likely due to hctz.  Adequate hydration is encouraged.  3. CAD Stable No change required today  4. HTN Stable No change required today  Return to see Dr Swaziland in 6 months carelink Return to see Nehemiah Settle in the device clinic in 1 year

## 2013-05-24 NOTE — Patient Instructions (Signed)
Your physician wants you to follow-up in: 6 months with Dr SwazilandJordan and 12 months with Sharrell KuBrooke Edmisten,PA You will receive a reminder letter in the mail two months in advance. If you don't receive a letter, please call our office to schedule the follow-up appointment.   Remote monitoring is used to monitor your Pacemaker of ICD from home. This monitoring reduces the number of office visits required to check your device to one time per year. It allows us to keep an eye on the functioning of your device to ensure it is working properly. You are scheduled for a device check from home on 08/29/13. You may send your transmission at any time that day. If you have a wireless device, the transmission will be sent automatically. After your physician reviews your transmission, you will receive a postcard with your next transmission date.

## 2013-08-29 ENCOUNTER — Encounter: Payer: Self-pay | Admitting: Internal Medicine

## 2013-08-29 ENCOUNTER — Ambulatory Visit (INDEPENDENT_AMBULATORY_CARE_PROVIDER_SITE_OTHER): Payer: Medicare PPO | Admitting: *Deleted

## 2013-08-29 DIAGNOSIS — I495 Sick sinus syndrome: Secondary | ICD-10-CM

## 2013-08-30 LAB — MDC_IDC_ENUM_SESS_TYPE_REMOTE
Battery Remaining Longevity: 26 mo
Brady Statistic AP VP Percent: 0 %
Brady Statistic AP VS Percent: 86 %
Brady Statistic AS VS Percent: 14 %
Date Time Interrogation Session: 20150527005354
Lead Channel Impedance Value: 419 Ohm
Lead Channel Impedance Value: 708 Ohm
Lead Channel Pacing Threshold Amplitude: 0.75 V
Lead Channel Pacing Threshold Pulse Width: 0.4 ms
Lead Channel Pacing Threshold Pulse Width: 0.4 ms
Lead Channel Sensing Intrinsic Amplitude: 16 mV
Lead Channel Setting Pacing Pulse Width: 0.4 ms
Lead Channel Setting Sensing Sensitivity: 2.8 mV
MDC IDC MSMT BATTERY IMPEDANCE: 1896 Ohm
MDC IDC MSMT BATTERY VOLTAGE: 2.74 V
MDC IDC MSMT LEADCHNL RA PACING THRESHOLD AMPLITUDE: 0.75 V
MDC IDC SET LEADCHNL RA PACING AMPLITUDE: 2 V
MDC IDC SET LEADCHNL RV PACING AMPLITUDE: 2.5 V
MDC IDC STAT BRADY AS VP PERCENT: 0 %

## 2013-08-30 NOTE — Progress Notes (Signed)
Remote pacemaker transmission.   

## 2013-09-15 ENCOUNTER — Encounter: Payer: Self-pay | Admitting: Cardiology

## 2013-12-04 ENCOUNTER — Telehealth: Payer: Self-pay | Admitting: Cardiology

## 2013-12-04 ENCOUNTER — Encounter: Payer: Medicare PPO | Admitting: *Deleted

## 2013-12-04 NOTE — Telephone Encounter (Signed)
Spoke with pt and reminded pt of remote transmission that is due today. Pt verbalized understanding.   

## 2013-12-05 ENCOUNTER — Encounter: Payer: Self-pay | Admitting: Cardiology

## 2013-12-12 ENCOUNTER — Telehealth: Payer: Self-pay | Admitting: Internal Medicine

## 2013-12-12 ENCOUNTER — Ambulatory Visit (INDEPENDENT_AMBULATORY_CARE_PROVIDER_SITE_OTHER): Payer: Medicare PPO | Admitting: *Deleted

## 2013-12-12 DIAGNOSIS — I495 Sick sinus syndrome: Secondary | ICD-10-CM

## 2013-12-12 LAB — MDC_IDC_ENUM_SESS_TYPE_INCLINIC
Battery Impedance: 2076 Ohm
Battery Voltage: 2.73 V
Brady Statistic AP VP Percent: 0 %
Brady Statistic AP VS Percent: 87 %
Brady Statistic AS VS Percent: 13 %
Date Time Interrogation Session: 20150908161824
Lead Channel Impedance Value: 412 Ohm
Lead Channel Impedance Value: 561 Ohm
Lead Channel Pacing Threshold Pulse Width: 0.4 ms
Lead Channel Pacing Threshold Pulse Width: 0.4 ms
Lead Channel Sensing Intrinsic Amplitude: 2.8 mV
Lead Channel Sensing Intrinsic Amplitude: 8 mV
Lead Channel Setting Pacing Amplitude: 2.5 V
Lead Channel Setting Sensing Sensitivity: 2.8 mV
MDC IDC MSMT BATTERY REMAINING LONGEVITY: 24 mo
MDC IDC MSMT LEADCHNL RA PACING THRESHOLD AMPLITUDE: 0.5 V
MDC IDC MSMT LEADCHNL RV PACING THRESHOLD AMPLITUDE: 0.5 V
MDC IDC SET LEADCHNL RA PACING AMPLITUDE: 2 V
MDC IDC SET LEADCHNL RV PACING PULSEWIDTH: 0.4 ms
MDC IDC STAT BRADY AS VP PERCENT: 0 %

## 2013-12-12 NOTE — Telephone Encounter (Signed)
Pt has attempted to send transmission from home multiple times with no success. Pt is going out of town for 8 days and normally is not in [place] and is requesting to come in office to have device checked. Pt agreed to come to office at 3 today.

## 2013-12-12 NOTE — Progress Notes (Signed)
Pacemaker check in clinic. Normal device function. Thresholds, sensing, impedances consistent with previous measurements. Device programmed to maximize longevity. 12 mode switches all < 1 minute.  23 high ventricular rates noted 3-12 seconds, max V 400.   Device programmed at appropriate safety margins. Histogram distribution appropriate for patient activity level. Device programmed to optimize intrinsic conduction. Estimated longevity 24 months.   Patient education completed.  ROV in December with the device clinic.

## 2013-12-12 NOTE — Telephone Encounter (Signed)
Follow up:   Pt called to confirm his transmission was received.  Pt is going on a trip this weekend and wants to make sure it went through.   Please give pt a call pt also has some questions about his land line.

## 2013-12-12 NOTE — Telephone Encounter (Signed)
Pt is going to call back when he gets home.

## 2013-12-20 ENCOUNTER — Encounter: Payer: Self-pay | Admitting: Internal Medicine

## 2014-03-15 ENCOUNTER — Encounter: Payer: Self-pay | Admitting: Internal Medicine

## 2014-04-02 ENCOUNTER — Ambulatory Visit (INDEPENDENT_AMBULATORY_CARE_PROVIDER_SITE_OTHER): Payer: Medicare PPO | Admitting: *Deleted

## 2014-04-02 ENCOUNTER — Encounter: Payer: Self-pay | Admitting: Internal Medicine

## 2014-04-02 DIAGNOSIS — I495 Sick sinus syndrome: Secondary | ICD-10-CM

## 2014-04-02 LAB — MDC_IDC_ENUM_SESS_TYPE_INCLINIC
Battery Impedance: 2306 Ohm
Battery Voltage: 2.72 V
Brady Statistic AS VP Percent: 0 %
Brady Statistic AS VS Percent: 13 %
Date Time Interrogation Session: 20151228144708
Lead Channel Impedance Value: 439 Ohm
Lead Channel Pacing Threshold Amplitude: 0.75 V
Lead Channel Pacing Threshold Pulse Width: 0.4 ms
Lead Channel Setting Pacing Amplitude: 2 V
Lead Channel Setting Pacing Amplitude: 2.5 V
Lead Channel Setting Sensing Sensitivity: 2.8 mV
MDC IDC MSMT BATTERY REMAINING LONGEVITY: 21 mo
MDC IDC MSMT LEADCHNL RA PACING THRESHOLD AMPLITUDE: 0.5 V
MDC IDC MSMT LEADCHNL RA PACING THRESHOLD PULSEWIDTH: 0.4 ms
MDC IDC MSMT LEADCHNL RA SENSING INTR AMPL: 2.8 mV
MDC IDC MSMT LEADCHNL RV IMPEDANCE VALUE: 562 Ohm
MDC IDC MSMT LEADCHNL RV SENSING INTR AMPL: 8 mV
MDC IDC SET LEADCHNL RV PACING PULSEWIDTH: 0.4 ms
MDC IDC STAT BRADY AP VP PERCENT: 0 %
MDC IDC STAT BRADY AP VS PERCENT: 87 %

## 2014-04-02 NOTE — Progress Notes (Signed)
Pacemaker check in clinic. Normal device function. Thresholds, sensing, impedances consistent with previous measurements. Device programmed to maximize longevity. 3 mode switches (<0.1%)---all <30 sec, no EGMS. 6 high ventricular rates noted---max dur. 5 sec, Max V >400---noise (uni sense). Device programmed at appropriate safety margins. Histogram distribution appropriate for patient activity level. Device programmed to optimize intrinsic conduction. Estimated longevity 21 months. Patient will follow up with JA on 2-25.

## 2014-05-02 DIAGNOSIS — H40013 Open angle with borderline findings, low risk, bilateral: Secondary | ICD-10-CM | POA: Diagnosis not present

## 2014-05-23 DIAGNOSIS — H402221 Chronic angle-closure glaucoma, left eye, mild stage: Secondary | ICD-10-CM | POA: Diagnosis not present

## 2014-05-23 DIAGNOSIS — H2512 Age-related nuclear cataract, left eye: Secondary | ICD-10-CM | POA: Diagnosis not present

## 2014-05-23 DIAGNOSIS — H25012 Cortical age-related cataract, left eye: Secondary | ICD-10-CM | POA: Diagnosis not present

## 2014-05-23 DIAGNOSIS — H402212 Chronic angle-closure glaucoma, right eye, moderate stage: Secondary | ICD-10-CM | POA: Diagnosis not present

## 2014-05-31 ENCOUNTER — Ambulatory Visit (INDEPENDENT_AMBULATORY_CARE_PROVIDER_SITE_OTHER): Payer: Medicare PPO | Admitting: Internal Medicine

## 2014-05-31 ENCOUNTER — Encounter: Payer: Self-pay | Admitting: Internal Medicine

## 2014-05-31 VITALS — BP 122/72 | HR 84 | Ht 70.0 in | Wt 179.8 lb

## 2014-05-31 DIAGNOSIS — I2510 Atherosclerotic heart disease of native coronary artery without angina pectoris: Secondary | ICD-10-CM

## 2014-05-31 DIAGNOSIS — I10 Essential (primary) hypertension: Secondary | ICD-10-CM

## 2014-05-31 DIAGNOSIS — I495 Sick sinus syndrome: Secondary | ICD-10-CM

## 2014-05-31 LAB — MDC_IDC_ENUM_SESS_TYPE_INCLINIC
Battery Voltage: 2.71 V
Brady Statistic AP VP Percent: 0 %
Brady Statistic AP VS Percent: 84 %
Brady Statistic AS VP Percent: 0 %
Brady Statistic AS VS Percent: 16 %
Lead Channel Impedance Value: 520 Ohm
Lead Channel Pacing Threshold Amplitude: 0.5 V
Lead Channel Pacing Threshold Amplitude: 0.75 V
Lead Channel Sensing Intrinsic Amplitude: 2.8 mV
Lead Channel Sensing Intrinsic Amplitude: 8 mV
Lead Channel Setting Pacing Amplitude: 2 V
Lead Channel Setting Pacing Pulse Width: 0.4 ms
Lead Channel Setting Sensing Sensitivity: 2.8 mV
MDC IDC MSMT BATTERY IMPEDANCE: 2443 Ohm
MDC IDC MSMT BATTERY REMAINING LONGEVITY: 20 mo
MDC IDC MSMT LEADCHNL RA IMPEDANCE VALUE: 424 Ohm
MDC IDC MSMT LEADCHNL RA PACING THRESHOLD PULSEWIDTH: 0.4 ms
MDC IDC MSMT LEADCHNL RV PACING THRESHOLD PULSEWIDTH: 0.4 ms
MDC IDC SESS DTM: 20160225125621
MDC IDC SET LEADCHNL RV PACING AMPLITUDE: 2.5 V

## 2014-05-31 NOTE — Progress Notes (Signed)
PCP:  Dr Dossie Arbouran Paterson Primary Cardiologist:  Dr SwazilandJordan  The patient presents today for routine electrophysiology followup.  Since last being seen in our clinic, the patient reports doing very well. He remains active for his age.  Today, he denies symptoms of palpitations, chest pain, shortness of breath, orthopnea, PND, lower extremity edema, dizziness, presyncope, syncope, or neurologic sequela.  The patient feels that he is tolerating medications without difficulties and is otherwise without complaint today.   Past Medical History  Diagnosis Date  . Essential hypertension, benign   . CAD     s/p CABG  . Sick sinus syndrome     s/p PPM (MDT) by Dr Reyes IvanKersey 2008  . Dyslipidemia   . Hematuria   . Diabetes mellitus   . RBBB    Past Surgical History  Procedure Laterality Date  . Dual-chamber permanent pacemaker implant 2008  06/23/2006    MDT Adapta implanted by Dr Reyes IvanKersey  . Coronary artery bypass grafting.      lima-lad,svg-OM1-2,svg-rca  . Hernia repair  1995    right  . Hand surgery  2004    Current Outpatient Prescriptions  Medication Sig Dispense Refill  . aspirin 81 MG tablet Take 81 mg by mouth daily.    Marland Kitchen. diltiazem (CARDIZEM CD) 360 MG 24 hr capsule Take 1 capsule (360 mg total) by mouth daily. 90 capsule 3  . lisinopril-hydrochlorothiazide (PRINZIDE,ZESTORETIC) 20-12.5 MG per tablet Take 1 tablet by mouth daily.     . metFORMIN (GLUCOPHAGE-XR) 500 MG 24 hr tablet Take 1,000 mg by mouth daily with supper.     . Multiple Vitamin (MULTI-VITAMIN PO) Take 1 tablet by mouth daily.     . pravastatin (PRAVACHOL) 40 MG tablet Take 40 mg by mouth at bedtime.     Marland Kitchen. VITAMIN E PO Take 400 Units by mouth daily.      No current facility-administered medications for this visit.    No Known Allergies  History   Social History  . Marital Status: Widowed    Spouse Name: N/A  . Number of Children: 2  . Years of Education: N/A   Occupational History  . teacher     retired    Social History Main Topics  . Smoking status: Never Smoker   . Smokeless tobacco: Not on file  . Alcohol Use: No  . Drug Use: No  . Sexual Activity: Not on file   Other Topics Concern  . Not on file   Social History Narrative    Lives in [place] and attends [employer]   Physical Exam: Filed Vitals:   05/31/14 1101  BP: 122/72  Pulse: 84  Height: 5\' 10"  (1.778 m)  Weight: 179 lb 12.8 oz (81.557 kg)    GEN- The patient is well appearing, alert and oriented x 3 today.   Head- normocephalic, atraumatic Eyes-  Sclera clear, conjunctiva pink Ears- hearing intact Oropharynx- clear Neck- supple, no JVP Lymph- no cervical lymphadenopathy Lungs- Clear to ausculation bilaterally, normal work of breathing Chest- pacemaker pocket is well healed Heart- Regular rate and rhythm, no murmurs, rubs or gallops, PMI not laterally displaced GI- soft, NT, ND, + BS Extremities- no clubbing, cyanosis, or edema  Pacemaker interrogation- reviewed in detail today,  See PACEART report  Assessment and Plan:  1. Sick sinus syndrome Normal pacemaker function See Pace Art report No changes today RV lead impedance/ threshold bipolar were previously noted to be elevated.  2 years ago I reprogrammed unipolar for  V pacing and he has done very well.  He does not pace in the ventricule.  There is a little noise on the V lead.  Will adjust sensitivity upon return if this becomes more noticeable. He has about 20 months estimated longevity.    2. CAD Stable No change required today  3. HTN Stable No change required today  Return to see Dr Swaziland as scheduled carelink Return to see me in the device clinic in 1 year

## 2014-05-31 NOTE — Patient Instructions (Signed)
Your physician wants you to follow-up in: 12 months with Dr Allred You will receive a reminder letter in the mail two months in advance. If you don't receive a letter, please call our office to schedule the follow-up appointment.  Remote monitoring is used to monitor your Pacemaker or ICD from home. This monitoring reduces the number of office visits required to check your device to one time per year. It allows us to keep an eye on the functioning of your device to ensure it is working properly. You are scheduled for a device check from home on 08/30/14. You may send your transmission at any time that day. If you have a wireless device, the transmission will be sent automatically. After your physician reviews your transmission, you will receive a postcard with your next transmission date.    

## 2014-06-19 ENCOUNTER — Encounter: Payer: Self-pay | Admitting: Internal Medicine

## 2014-07-10 DIAGNOSIS — H2512 Age-related nuclear cataract, left eye: Secondary | ICD-10-CM | POA: Diagnosis not present

## 2014-08-03 DIAGNOSIS — H25011 Cortical age-related cataract, right eye: Secondary | ICD-10-CM | POA: Diagnosis not present

## 2014-08-03 DIAGNOSIS — H2511 Age-related nuclear cataract, right eye: Secondary | ICD-10-CM | POA: Diagnosis not present

## 2014-08-30 ENCOUNTER — Ambulatory Visit (INDEPENDENT_AMBULATORY_CARE_PROVIDER_SITE_OTHER): Payer: Medicare PPO | Admitting: *Deleted

## 2014-08-30 ENCOUNTER — Telehealth: Payer: Self-pay | Admitting: Cardiology

## 2014-08-30 DIAGNOSIS — I495 Sick sinus syndrome: Secondary | ICD-10-CM | POA: Diagnosis not present

## 2014-08-30 NOTE — Progress Notes (Signed)
Remote pacemaker transmission.   

## 2014-08-30 NOTE — Telephone Encounter (Signed)
Spoke with pt and reminded pt of remote transmission that is due today. Pt verbalized understanding.   

## 2014-09-05 ENCOUNTER — Telehealth: Payer: Self-pay | Admitting: Cardiology

## 2014-09-05 ENCOUNTER — Telehealth: Payer: Self-pay | Admitting: Internal Medicine

## 2014-09-05 NOTE — Telephone Encounter (Signed)
Informed pt that transmission was received but a report has not been created yet. Informed pt that once a report is created it will be given to MD for him to sign off on and then a result letter will be mailed to him. Pt verbalized understanding.

## 2014-09-05 NOTE — Telephone Encounter (Signed)
New Message  Pt called for remote signal results

## 2014-09-07 LAB — CUP PACEART REMOTE DEVICE CHECK
Battery Remaining Longevity: 16 mo
Brady Statistic AP VP Percent: 0 %
Brady Statistic AS VP Percent: 0 %
Lead Channel Impedance Value: 420 Ohm
Lead Channel Pacing Threshold Amplitude: 0.75 V
Lead Channel Pacing Threshold Amplitude: 0.875 V
Lead Channel Pacing Threshold Pulse Width: 0.4 ms
Lead Channel Pacing Threshold Pulse Width: 0.4 ms
Lead Channel Setting Pacing Amplitude: 2 V
Lead Channel Setting Pacing Amplitude: 2.5 V
Lead Channel Setting Sensing Sensitivity: 2.8 mV
MDC IDC MSMT BATTERY IMPEDANCE: 2941 Ohm
MDC IDC MSMT BATTERY VOLTAGE: 2.7 V
MDC IDC MSMT LEADCHNL RV IMPEDANCE VALUE: 546 Ohm
MDC IDC SESS DTM: 20160526172623
MDC IDC SET LEADCHNL RV PACING PULSEWIDTH: 0.4 ms
MDC IDC STAT BRADY AP VS PERCENT: 84 %
MDC IDC STAT BRADY AS VS PERCENT: 16 %

## 2014-09-12 ENCOUNTER — Encounter: Payer: Self-pay | Admitting: Cardiology

## 2014-09-19 ENCOUNTER — Encounter: Payer: Self-pay | Admitting: Internal Medicine

## 2014-09-24 DIAGNOSIS — H25011 Cortical age-related cataract, right eye: Secondary | ICD-10-CM | POA: Diagnosis not present

## 2014-09-24 DIAGNOSIS — H2511 Age-related nuclear cataract, right eye: Secondary | ICD-10-CM | POA: Diagnosis not present

## 2014-10-04 NOTE — Telephone Encounter (Signed)
error 

## 2014-10-12 DIAGNOSIS — C44321 Squamous cell carcinoma of skin of nose: Secondary | ICD-10-CM | POA: Diagnosis not present

## 2014-10-12 DIAGNOSIS — D485 Neoplasm of uncertain behavior of skin: Secondary | ICD-10-CM | POA: Diagnosis not present

## 2014-10-23 DIAGNOSIS — H2511 Age-related nuclear cataract, right eye: Secondary | ICD-10-CM | POA: Diagnosis not present

## 2014-11-01 DIAGNOSIS — Z85828 Personal history of other malignant neoplasm of skin: Secondary | ICD-10-CM | POA: Diagnosis not present

## 2014-11-01 DIAGNOSIS — C44321 Squamous cell carcinoma of skin of nose: Secondary | ICD-10-CM | POA: Diagnosis not present

## 2014-12-06 ENCOUNTER — Telehealth: Payer: Self-pay | Admitting: Cardiology

## 2014-12-06 ENCOUNTER — Ambulatory Visit (INDEPENDENT_AMBULATORY_CARE_PROVIDER_SITE_OTHER): Payer: Medicare PPO | Admitting: *Deleted

## 2014-12-06 DIAGNOSIS — I495 Sick sinus syndrome: Secondary | ICD-10-CM

## 2014-12-06 NOTE — Telephone Encounter (Signed)
LMOVM reminding pt to send remote transmission.   

## 2014-12-07 NOTE — Progress Notes (Signed)
Remote pacemaker transmission.   

## 2014-12-14 LAB — CUP PACEART REMOTE DEVICE CHECK
Brady Statistic AP VP Percent: 0.3 %
Brady Statistic AP VS Percent: 84.6 %
Brady Statistic AS VS Percent: 15.1 %
Lead Channel Impedance Value: 423 Ohm
Lead Channel Impedance Value: 540 Ohm
Lead Channel Pacing Threshold Amplitude: 0.75 V
Lead Channel Pacing Threshold Pulse Width: 0.4 ms
Lead Channel Sensing Intrinsic Amplitude: 5.6 mV
Lead Channel Setting Pacing Amplitude: 2 V
Lead Channel Setting Pacing Amplitude: 2.5 V
Lead Channel Setting Sensing Sensitivity: 2.8 mV
MDC IDC MSMT LEADCHNL RV PACING THRESHOLD AMPLITUDE: 0.625 V
MDC IDC MSMT LEADCHNL RV PACING THRESHOLD PULSEWIDTH: 0.4 ms
MDC IDC SESS DTM: 20160909104012
MDC IDC SET LEADCHNL RV PACING PULSEWIDTH: 0.4 ms
MDC IDC STAT BRADY AS VP PERCENT: 0.1 %

## 2014-12-21 DIAGNOSIS — I872 Venous insufficiency (chronic) (peripheral): Secondary | ICD-10-CM | POA: Diagnosis not present

## 2014-12-21 DIAGNOSIS — I10 Essential (primary) hypertension: Secondary | ICD-10-CM | POA: Diagnosis not present

## 2014-12-21 DIAGNOSIS — E119 Type 2 diabetes mellitus without complications: Secondary | ICD-10-CM | POA: Diagnosis not present

## 2014-12-21 DIAGNOSIS — Z23 Encounter for immunization: Secondary | ICD-10-CM | POA: Diagnosis not present

## 2014-12-21 DIAGNOSIS — Z6825 Body mass index (BMI) 25.0-25.9, adult: Secondary | ICD-10-CM | POA: Diagnosis not present

## 2014-12-21 DIAGNOSIS — I2510 Atherosclerotic heart disease of native coronary artery without angina pectoris: Secondary | ICD-10-CM | POA: Diagnosis not present

## 2014-12-25 ENCOUNTER — Encounter: Payer: Self-pay | Admitting: Cardiology

## 2015-01-09 ENCOUNTER — Encounter: Payer: Self-pay | Admitting: Internal Medicine

## 2015-02-19 DIAGNOSIS — H402221 Chronic angle-closure glaucoma, left eye, mild stage: Secondary | ICD-10-CM | POA: Diagnosis not present

## 2015-02-19 DIAGNOSIS — H5213 Myopia, bilateral: Secondary | ICD-10-CM | POA: Diagnosis not present

## 2015-02-19 DIAGNOSIS — H402212 Chronic angle-closure glaucoma, right eye, moderate stage: Secondary | ICD-10-CM | POA: Diagnosis not present

## 2015-03-11 ENCOUNTER — Telehealth: Payer: Self-pay | Admitting: Cardiology

## 2015-03-11 ENCOUNTER — Ambulatory Visit (INDEPENDENT_AMBULATORY_CARE_PROVIDER_SITE_OTHER): Payer: Medicare PPO | Admitting: *Deleted

## 2015-03-11 DIAGNOSIS — I495 Sick sinus syndrome: Secondary | ICD-10-CM | POA: Diagnosis not present

## 2015-03-11 NOTE — Telephone Encounter (Signed)
LMOVM reminding pt to send remote transmission.   

## 2015-03-11 NOTE — Progress Notes (Signed)
Remote pacemaker transmission.   

## 2015-03-20 LAB — CUP PACEART REMOTE DEVICE CHECK
Battery Impedance: 3732 Ohm
Battery Remaining Longevity: 10 mo
Battery Voltage: 2.68 V
Brady Statistic AP VP Percent: 0 %
Brady Statistic AS VP Percent: 0 %
Date Time Interrogation Session: 20161205183908
Implantable Lead Implant Date: 20080319
Implantable Lead Implant Date: 20080319
Implantable Lead Location: 753860
Implantable Lead Model: 5076
Implantable Lead Model: 5076
Lead Channel Impedance Value: 426 Ohm
Lead Channel Setting Pacing Amplitude: 2.5 V
MDC IDC LEAD LOCATION: 753859
MDC IDC MSMT LEADCHNL RV IMPEDANCE VALUE: 528 Ohm
MDC IDC SET LEADCHNL RA PACING AMPLITUDE: 2 V
MDC IDC SET LEADCHNL RV PACING PULSEWIDTH: 0.4 ms
MDC IDC SET LEADCHNL RV SENSING SENSITIVITY: 2.8 mV
MDC IDC STAT BRADY AP VS PERCENT: 86 %
MDC IDC STAT BRADY AS VS PERCENT: 14 %

## 2015-03-22 ENCOUNTER — Encounter: Payer: Self-pay | Admitting: Cardiology

## 2015-04-19 ENCOUNTER — Encounter: Payer: Self-pay | Admitting: Internal Medicine

## 2015-06-10 ENCOUNTER — Encounter: Payer: Self-pay | Admitting: Internal Medicine

## 2015-06-17 ENCOUNTER — Encounter: Payer: Self-pay | Admitting: Internal Medicine

## 2015-06-17 ENCOUNTER — Ambulatory Visit (INDEPENDENT_AMBULATORY_CARE_PROVIDER_SITE_OTHER): Payer: Medicare Other | Admitting: Internal Medicine

## 2015-06-17 VITALS — BP 130/68 | HR 79 | Ht 70.0 in | Wt 175.0 lb

## 2015-06-17 DIAGNOSIS — I10 Essential (primary) hypertension: Secondary | ICD-10-CM | POA: Diagnosis not present

## 2015-06-17 DIAGNOSIS — I495 Sick sinus syndrome: Secondary | ICD-10-CM

## 2015-06-17 LAB — CUP PACEART INCLINIC DEVICE CHECK
Battery Impedance: 4441 Ohm
Brady Statistic AP VP Percent: 0 %
Brady Statistic AP VS Percent: 85 %
Brady Statistic AS VP Percent: 0 %
Brady Statistic AS VS Percent: 14 %
Implantable Lead Implant Date: 20080319
Implantable Lead Location: 753860
Implantable Lead Model: 5076
Lead Channel Impedance Value: 436 Ohm
Lead Channel Pacing Threshold Amplitude: 0.75 V
Lead Channel Pacing Threshold Amplitude: 0.75 V
Lead Channel Pacing Threshold Pulse Width: 0.4 ms
Lead Channel Pacing Threshold Pulse Width: 0.4 ms
Lead Channel Sensing Intrinsic Amplitude: 4 mV
MDC IDC LEAD IMPLANT DT: 20080319
MDC IDC LEAD LOCATION: 753859
MDC IDC MSMT BATTERY REMAINING LONGEVITY: 7 mo
MDC IDC MSMT BATTERY VOLTAGE: 2.64 V
MDC IDC MSMT LEADCHNL RA PACING THRESHOLD AMPLITUDE: 0.625 V
MDC IDC MSMT LEADCHNL RA PACING THRESHOLD AMPLITUDE: 0.75 V
MDC IDC MSMT LEADCHNL RA PACING THRESHOLD PULSEWIDTH: 0.4 ms
MDC IDC MSMT LEADCHNL RV IMPEDANCE VALUE: 567 Ohm
MDC IDC MSMT LEADCHNL RV PACING THRESHOLD PULSEWIDTH: 0.4 ms
MDC IDC MSMT LEADCHNL RV SENSING INTR AMPL: 8 mV
MDC IDC SESS DTM: 20170313155301
MDC IDC SET LEADCHNL RA PACING AMPLITUDE: 2 V
MDC IDC SET LEADCHNL RV PACING AMPLITUDE: 2.5 V
MDC IDC SET LEADCHNL RV PACING PULSEWIDTH: 0.4 ms
MDC IDC SET LEADCHNL RV SENSING SENSITIVITY: 4 mV

## 2015-06-17 NOTE — Progress Notes (Signed)
PCP:  Dr Dossie Arbouran Paterson Primary Cardiologist:  Dr SwazilandJordan  The patient presents today for routine electrophysiology followup.  Since last being seen in our clinic, the patient reports doing very well. He remains active for his age.  Today, he denies symptoms of palpitations, chest pain, shortness of breath, orthopnea, PND, lower extremity edema, dizziness, presyncope, syncope, or neurologic sequela.  The patient feels that he is tolerating medications without difficulties and is otherwise without complaint today.   Past Medical History  Diagnosis Date  . Essential hypertension, benign   . CAD     s/p CABG  . Sick sinus syndrome [facility])     s/p PPM (MDT) by Dr Reyes IvanKersey 2008  . Dyslipidemia   . Hematuria   . Diabetes mellitus   . RBBB    Past Surgical History  Procedure Laterality Date  . Dual-chamber permanent pacemaker implant 2008  06/23/2006    MDT Adapta implanted by Dr Reyes IvanKersey  . Coronary artery bypass grafting.      lima-lad,svg-OM1-2,svg-rca  . Hernia repair  1995    right  . Hand surgery  2004    Current Outpatient Prescriptions  Medication Sig Dispense Refill  . aspirin 81 MG tablet Take 81 mg by mouth daily.    Marland Kitchen. diltiazem (TIAZAC) 360 MG 24 hr capsule Take 1 capsule by mouth daily.    Marland Kitchen. lisinopril-hydrochlorothiazide (PRINZIDE,ZESTORETIC) 20-12.5 MG per tablet Take 1 tablet by mouth daily.     . metFORMIN (GLUCOPHAGE-XR) 500 MG 24 hr tablet Take 1,000 mg by mouth daily with supper.     . Multiple Vitamin (MULTI-VITAMIN PO) Take 1 tablet by mouth daily.     . pravastatin (PRAVACHOL) 40 MG tablet Take 40 mg by mouth at bedtime.     Marland Kitchen. VITAMIN E PO Take 400 Units by mouth daily.      No current facility-administered medications for this visit.    No Known Allergies  Social History   Social History  . Marital Status: Widowed    Spouse Name: N/A  . Number of Children: 2  . Years of Education: N/A   Occupational History  . teacher     retired   Social History Main  Topics  . Smoking status: Never Smoker   . Smokeless tobacco: Not on file  . Alcohol Use: No  . Drug Use: No  . Sexual Activity: Not on file   Other Topics Concern  . Not on file   Social History Narrative    Lives in [place] and attends [employer]   Physical Exam: Filed Vitals:   06/17/15 1502  BP: 130/68  Pulse: 79  Height: 5\' 10"  (1.778 m)  Weight: 175 lb (79.379 kg)    GEN- The patient is elderly appearing, alert and oriented x 3 today.   Head- normocephalic, atraumatic Eyes-  Sclera clear, conjunctiva pink Ears- hearing intact Oropharynx- clear Neck- supple Lungs- Clear to ausculation bilaterally, normal work of breathing Chest- pacemaker pocket is well healed Heart- Regular rate and rhythm, no murmurs, rubs or gallops GI- soft, NT, ND, + BS Extremities- no clubbing, cyanosis, or edema  Pacemaker interrogation- reviewed in detail today,  See PACEART report  Assessment and Plan:  1. Sick sinus syndrome Normal pacemaker function See Pace Art report No changes today RV lead impedance/ threshold bipolar were previously noted to be elevated.  3 years ago I reprogrammed unipolar for V pacing and he has done very well.  He does not pace in the  ventricule.   V sensitivity changed from 2.8 to 4mV today for noise on V lead Estimated 7 months longevity, will start monthly Carelink transmissions.  Risks, benefits, and alternatives to pacemaker generator replacement were discussed in detail today.  The patient understands that risks include but are not limited to bleeding, infection, pneumothorax, perforation, tamponade, vascular damage, renal failure, MI, stroke, death, damage to his existing leads, and lead dislodgement and wishes to proceed once ERI. We will therefore follow with monthly carelink transmissions and proceed with generator change once ERI.  2. CAD Stable No change required today  3. HTN Stable No change required today  Return to  see Dr Swaziland as scheduled carelink  Hillis Range MD, [facility] 06/17/2015 3:39 PM

## 2015-06-17 NOTE — Patient Instructions (Addendum)
Medication Instructions:  Your physician recommends that you continue on your current medications as directed. Please refer to the Current Medication list given to you today.   Labwork: None ordered   Testing/Procedures: None ordered   Follow-Up: Remote monitoring is used to monitor your Pacemaker  from home. This monitoring reduces the number of office visits required to check your device to one time per year. It allows us to keep an eye on the functioning of your device to ensure it is working properly. You are scheduled for a device check from home on 07/19/2015. You may send your transmission at any time that day. If you have a wireless device, the transmission will be sent automatically. After your physician reviews your transmission, you will receive a postcard with your next transmission date.   Your physician wants you to follow-up in: 12 months with Dr Jacquiline DoeAllred You will receive a reminder letter in the mail two months in advance. If you don't receive a letter, please call our office to schedule the follow-up appointment.    Any Other Special Instructions Will Be Listed Below (If Applicable).     If you need a refill on your cardiac medications before your next appointment, please call your pharmacy.

## 2015-07-18 ENCOUNTER — Encounter: Payer: Medicare Other | Admitting: *Deleted

## 2015-07-22 ENCOUNTER — Encounter: Payer: Self-pay | Admitting: Cardiology

## 2015-07-26 ENCOUNTER — Ambulatory Visit (INDEPENDENT_AMBULATORY_CARE_PROVIDER_SITE_OTHER): Payer: Medicare Other | Admitting: *Deleted

## 2015-07-26 DIAGNOSIS — I495 Sick sinus syndrome: Secondary | ICD-10-CM

## 2015-07-29 NOTE — Progress Notes (Signed)
Remote pacemaker transmission.   

## 2015-08-29 ENCOUNTER — Ambulatory Visit (INDEPENDENT_AMBULATORY_CARE_PROVIDER_SITE_OTHER): Payer: Medicare Other | Admitting: *Deleted

## 2015-08-29 ENCOUNTER — Telehealth: Payer: Self-pay | Admitting: Cardiology

## 2015-08-29 DIAGNOSIS — I495 Sick sinus syndrome: Secondary | ICD-10-CM

## 2015-08-29 NOTE — Telephone Encounter (Signed)
LMOVM reminding pt to send remote transmission.   

## 2015-08-29 NOTE — Progress Notes (Signed)
Remote pacemaker transmission.   

## 2015-08-30 ENCOUNTER — Encounter: Payer: Self-pay | Admitting: Cardiology

## 2015-08-30 LAB — CUP PACEART REMOTE DEVICE CHECK
Battery Impedance: 5280 Ohm
Battery Impedance: 6396 Ohm
Battery Remaining Longevity: 3 mo
Battery Voltage: 2.58 V
Brady Statistic AP VP Percent: 0 %
Brady Statistic AP VS Percent: 85 %
Brady Statistic AS VS Percent: 15 %
Brady Statistic AS VS Percent: 19 %
Date Time Interrogation Session: 20170421190158
Implantable Lead Implant Date: 20080319
Implantable Lead Implant Date: 20080319
Implantable Lead Location: 753859
Implantable Lead Location: 753859
Implantable Lead Location: 753860
Implantable Lead Location: 753860
Implantable Lead Model: 5076
Implantable Lead Model: 5076
Implantable Lead Model: 5076
Lead Channel Impedance Value: 424 Ohm
Lead Channel Impedance Value: 479 Ohm
Lead Channel Impedance Value: 559 Ohm
Lead Channel Impedance Value: 600 Ohm
Lead Channel Setting Pacing Amplitude: 2 V
Lead Channel Setting Pacing Amplitude: 2.5 V
Lead Channel Setting Pacing Amplitude: 2.5 V
Lead Channel Setting Pacing Pulse Width: 0.4 ms
MDC IDC LEAD IMPLANT DT: 20080319
MDC IDC LEAD IMPLANT DT: 20080319
MDC IDC MSMT BATTERY REMAINING LONGEVITY: -1
MDC IDC MSMT BATTERY VOLTAGE: 2.6 V
MDC IDC SESS DTM: 20170525170528
MDC IDC SET LEADCHNL RA PACING AMPLITUDE: 2 V
MDC IDC SET LEADCHNL RV PACING PULSEWIDTH: 0.4 ms
MDC IDC SET LEADCHNL RV SENSING SENSITIVITY: 2.8 mV
MDC IDC SET LEADCHNL RV SENSING SENSITIVITY: 2.8 mV
MDC IDC STAT BRADY AP VP PERCENT: 0 %
MDC IDC STAT BRADY AP VS PERCENT: 80 %
MDC IDC STAT BRADY AS VP PERCENT: 0 %
MDC IDC STAT BRADY AS VP PERCENT: 0 %

## 2015-09-11 ENCOUNTER — Encounter: Payer: Self-pay | Admitting: Cardiology

## 2015-09-30 ENCOUNTER — Ambulatory Visit (INDEPENDENT_AMBULATORY_CARE_PROVIDER_SITE_OTHER): Payer: Medicare Other | Admitting: *Deleted

## 2015-09-30 ENCOUNTER — Telehealth: Payer: Self-pay | Admitting: Cardiology

## 2015-09-30 DIAGNOSIS — I495 Sick sinus syndrome: Secondary | ICD-10-CM | POA: Diagnosis not present

## 2015-09-30 LAB — CUP PACEART REMOTE DEVICE CHECK
Battery Impedance: 7240 Ohm
Battery Voltage: 2.6 V
Brady Statistic RV Percent Paced: 0 %
Implantable Lead Implant Date: 20080319
Implantable Lead Location: 753859
Implantable Lead Location: 753860
Lead Channel Impedance Value: 496 Ohm
Lead Channel Setting Pacing Amplitude: 2.5 V
Lead Channel Setting Pacing Pulse Width: 0.4 ms
Lead Channel Setting Sensing Sensitivity: 2 mV
MDC IDC LEAD IMPLANT DT: 20080319
MDC IDC MSMT BATTERY REMAINING LONGEVITY: 1 mo
MDC IDC MSMT LEADCHNL RA IMPEDANCE VALUE: 67 Ohm
MDC IDC SESS DTM: 20170626113831

## 2015-09-30 NOTE — Progress Notes (Signed)
Remote pacemaker transmission.   

## 2015-09-30 NOTE — Telephone Encounter (Signed)
Opened in error

## 2015-09-30 NOTE — Telephone Encounter (Signed)
Spoke w/ pt and informed pt that pt device has reached ERI. Informed pt that he may here the alert tone go off every day for the next 16 days. After day 16 the alert will time out. This does not mean that the problem has went away the problem is still there and pt will need to keep his appt. With the provider. Informed her that a scheduler will call to schedule an appt w/ the a provider to discuss the procedure and the procedure will be scheduled for another day. pt verbalized understanding.

## 2015-10-10 ENCOUNTER — Telehealth: Payer: Self-pay | Admitting: Nurse Practitioner

## 2015-10-10 NOTE — Telephone Encounter (Signed)
Spoke to patient.  Risks, benefits to pacemaker generator change discussed with patient by Dr Johney FrameAllred at visit in March of 2017.  He has reached ERI as of 09/15/15.  He historically atrially paces and reverted to VVI 65 (unipolar). PPM gen change scheduled for Tuesday 7/11.  Appt for 7/10 with Dr Johney FrameAllred cancelled. Pt to arrive at 1PM to short stay.  NPO after breakfast.  Pt aware and agrees with plan.   Gypsy BalsamAmber Oluwatimileyin Vivier, NP 10/10/2015 3:44 PM

## 2015-10-14 ENCOUNTER — Encounter: Payer: Medicare Other | Admitting: Internal Medicine

## 2015-10-14 MED ORDER — CHLORHEXIDINE GLUCONATE 4 % EX LIQD
60.0000 mL | Freq: Once | CUTANEOUS | Status: DC
Start: 2015-10-14 — End: 2015-10-15
  Filled 2015-10-14: qty 60

## 2015-10-14 MED ORDER — SODIUM CHLORIDE 0.9 % IV SOLN
INTRAVENOUS | Status: DC
  Administered 2015-10-15: 14:00:00 via INTRAVENOUS

## 2015-10-14 MED ORDER — CEFAZOLIN SODIUM-DEXTROSE 2-4 GM/100ML-% IV SOLN
2.0000 g | INTRAVENOUS | Status: AC
  Administered 2015-10-15: 2 g via INTRAVENOUS
  Filled 2015-10-14: qty 100

## 2015-10-14 MED ORDER — SODIUM CHLORIDE 0.9 % IR SOLN
80.0000 mg | Status: AC
  Administered 2015-10-15: 80 mg
  Filled 2015-10-14: qty 2

## 2015-10-15 ENCOUNTER — Ambulatory Visit (HOSPITAL_COMMUNITY)
Admission: RE | Admit: 2015-10-15 | Discharge: 2015-10-15 | Disposition: A | Discharge status: Home / Self Care | Payer: Medicare Other | Source: Ambulatory Visit | Attending: Internal Medicine | Admitting: Internal Medicine

## 2015-10-15 ENCOUNTER — Telehealth: Payer: Self-pay | Admitting: Cardiology

## 2015-10-15 ENCOUNTER — Encounter (HOSPITAL_COMMUNITY)
Admission: RE | Disposition: A | Discharge status: Home / Self Care | Payer: Self-pay | Source: Ambulatory Visit | Attending: Internal Medicine

## 2015-10-15 DIAGNOSIS — Z45010 Encounter for checking and testing of cardiac pacemaker pulse generator [battery]: Secondary | ICD-10-CM | POA: Insufficient documentation

## 2015-10-15 DIAGNOSIS — E119 Type 2 diabetes mellitus without complications: Secondary | ICD-10-CM | POA: Diagnosis not present

## 2015-10-15 DIAGNOSIS — I495 Sick sinus syndrome: Secondary | ICD-10-CM | POA: Diagnosis present

## 2015-10-15 DIAGNOSIS — Z951 Presence of aortocoronary bypass graft: Secondary | ICD-10-CM | POA: Diagnosis not present

## 2015-10-15 DIAGNOSIS — I4510 Unspecified right bundle-branch block: Secondary | ICD-10-CM | POA: Insufficient documentation

## 2015-10-15 DIAGNOSIS — I10 Essential (primary) hypertension: Secondary | ICD-10-CM | POA: Insufficient documentation

## 2015-10-15 DIAGNOSIS — I2510 Atherosclerotic heart disease of native coronary artery without angina pectoris: Secondary | ICD-10-CM | POA: Diagnosis not present

## 2015-10-15 DIAGNOSIS — Z7982 Long term (current) use of aspirin: Secondary | ICD-10-CM | POA: Insufficient documentation

## 2015-10-15 DIAGNOSIS — E785 Hyperlipidemia, unspecified: Secondary | ICD-10-CM | POA: Diagnosis not present

## 2015-10-15 DIAGNOSIS — Z7984 Long term (current) use of oral hypoglycemic drugs: Secondary | ICD-10-CM | POA: Insufficient documentation

## 2015-10-15 HISTORY — PX: EP IMPLANTABLE DEVICE: SHX172B

## 2015-10-15 LAB — BASIC METABOLIC PANEL
Anion gap: 8 (ref 5–15)
BUN: 19 mg/dL (ref 6–20)
CO2: 26 mmol/L (ref 22–32)
CREATININE: 0.97 mg/dL (ref 0.61–1.24)
Calcium: 9.4 mg/dL (ref 8.9–10.3)
Chloride: 104 mmol/L (ref 101–111)
GFR calc Af Amer: >60 mL/min (ref >60)
GFR calc non Af Amer: >60 mL/min (ref >60)
GLUCOSE: 90 mg/dL (ref 65–99)
Potassium: 3.7 mmol/L (ref 3.5–5.1)
Sodium: 138 mmol/L (ref 135–145)

## 2015-10-15 LAB — CBC
HEMATOCRIT: 43.3 % (ref 39.0–52.0)
HEMOGLOBIN: 14.4 g/dL (ref 13.0–17.0)
MCH: 29.9 pg (ref 26.0–34.0)
MCHC: 33.3 g/dL (ref 30.0–36.0)
MCV: 90 fL (ref 78.0–100.0)
Platelets: 199 10*3/uL (ref 150–400)
RBC: 4.81 MIL/uL (ref 4.22–5.81)
RDW: 13.2 % (ref 11.5–15.5)
WBC: 8.1 10*3/uL (ref 4.0–10.5)

## 2015-10-15 LAB — GLUCOSE, CAPILLARY: Glucose-Capillary: 84 mg/dL (ref 65–99)

## 2015-10-15 LAB — SURGICAL PCR SCREEN
MRSA, PCR: NEGATIVE
STAPHYLOCOCCUS AUREUS: NEGATIVE

## 2015-10-15 SURGERY — PPM/BIV PPM GENERATOR CHANGEOUT
Anesthesia: LOCAL

## 2015-10-15 MED ORDER — SODIUM CHLORIDE 0.9 % IR SOLN
Status: AC
  Filled 2015-10-15: qty 2

## 2015-10-15 MED ORDER — LIDOCAINE HCL (PF) 1 % IJ SOLN
INTRAMUSCULAR | Status: AC
  Filled 2015-10-15: qty 60

## 2015-10-15 MED ORDER — MUPIROCIN 2 % EX OINT
TOPICAL_OINTMENT | CUTANEOUS | Status: AC
  Administered 2015-10-15: 1 via TOPICAL
  Filled 2015-10-15: qty 22

## 2015-10-15 MED ORDER — MUPIROCIN 2 % EX OINT
1.0000 "application " | TOPICAL_OINTMENT | Freq: Once | CUTANEOUS | Status: AC
  Administered 2015-10-15: 1 via TOPICAL
  Filled 2015-10-15: qty 22

## 2015-10-15 MED ORDER — CEFAZOLIN SODIUM-DEXTROSE 2-4 GM/100ML-% IV SOLN
INTRAVENOUS | Status: AC
Start: 2015-10-15 — End: 2015-10-15
  Filled 2015-10-15: qty 100

## 2015-10-15 MED ORDER — LIDOCAINE HCL (PF) 1 % IJ SOLN
INTRAMUSCULAR | Status: DC | PRN
  Administered 2015-10-15: 41 mL via INTRADERMAL

## 2015-10-15 SURGICAL SUPPLY — 5 items
CABLE SURGICAL S-101-97-12 (CABLE) ×2 IMPLANT
PACEMAKER ADAPTA DR ADDRL1 (Pacemaker) ×1 IMPLANT
PAD DEFIB LIFELINK (PAD) ×2 IMPLANT
PPM ADAPTA DR ADDRL1 (Pacemaker) ×2 IMPLANT
TRAY PACEMAKER INSERTION (PACKS) ×2 IMPLANT

## 2015-10-15 NOTE — H&P (Signed)
PCP: Dr Dossie Arbour Primary Cardiologist: Dr Swaziland  The patient presents today for pacemaker generator change. Since last being seen in our clinic, the patient reports doing very well. He remains active for his age. Today, he denies symptoms of palpitations, chest pain, shortness of breath, orthopnea, PND, lower extremity edema, dizziness, presyncope, syncope, or neurologic sequela. The patient feels that he is tolerating medications without difficulties and is otherwise without complaint today.   Past Medical History  Diagnosis Date  . Essential hypertension, benign   . CAD     s/p CABG  . Sick sinus syndrome [facility])     s/p PPM (MDT) by Dr Reyes Ivan 2008  . Dyslipidemia   . Hematuria   . Diabetes mellitus   . RBBB    Past Surgical History  Procedure Laterality Date  . Dual-chamber permanent pacemaker implant 2008  06/23/2006    MDT Adapta implanted by Dr Reyes Ivan  . Coronary artery bypass grafting.      lima-lad,svg-OM1-2,svg-rca  . Hernia repair  1995    right  . Hand surgery  2004    Current Outpatient Prescriptions  Medication Sig Dispense Refill  . aspirin 81 MG tablet Take 81 mg by mouth daily.    Marland Kitchen diltiazem (TIAZAC) 360 MG 24 hr capsule Take 1 capsule by mouth daily.    Marland Kitchen lisinopril-hydrochlorothiazide (PRINZIDE,ZESTORETIC) 20-12.5 MG per tablet Take 1 tablet by mouth daily.     . metFORMIN (GLUCOPHAGE-XR) 500 MG 24 hr tablet Take 1,000 mg by mouth daily with supper.     . Multiple Vitamin (MULTI-VITAMIN PO) Take 1 tablet by mouth daily.     . pravastatin (PRAVACHOL) 40 MG tablet Take 40 mg by mouth at bedtime.     Marland Kitchen VITAMIN E PO Take 400 Units by mouth daily.      No current facility-administered medications for this visit.    No Known Allergies  Social History   Social History  . Marital Status: Widowed    Spouse Name: N/A  . Number  of Children: 2  . Years of Education: N/A   Occupational History  . teacher     retired   Social History Main Topics  . Smoking status: Never Smoker   . Smokeless tobacco: Not on file  . Alcohol Use: No  . Drug Use: No  . Sexual Activity: Not on file   Other Topics Concern  . Not on file   Social History Narrative   Lives in [place] and attends [employer]   Physical Exam: Filed Vitals:   10/15/15 1259  BP: 123/77  Pulse: 65  Temp: 98 F (36.7 C)  Resp: 16    GEN- The patient is elderly appearing, alert and oriented x 3 today.  Head- normocephalic, atraumatic Eyes- Sclera clear, conjunctiva pink Ears- hearing intact Oropharynx- clear Neck- supple Lungs- Clear to ausculation bilaterally, normal work of breathing Chest- pacemaker pocket is well healed Heart- Regular rate and rhythm, no murmurs, rubs or gallops GI- soft, NT, ND, + BS Extremities- no clubbing, cyanosis, or edema  Pacemaker interrogation- reviewed in detail today, See PACEART report  Assessment and Plan:  1. Sick sinus syndrome Pacemaker has reached ERI batter status.  RV lead with chronically elevated bipolar threshold but preserved unipolar threshold.  Risks, benefits, and alternatives to pacemaker pulse generator replacement with possible RV lead revision were discussed in detail today.  The patient understands that risks include but are not limited to bleeding, infection, pneumothorax,  perforation, tamponade, vascular damage, renal failure, MI, stroke, death,  damage to his existing leads, and lead dislodgement and wishes to proceed.   He and I would like to avoid RV lead revision if suitable pacing numbers can be obtained at time of generator change.  2. CAD Stable No change required today  3. HTN Stable No change required today  Hillis RangeJames Money Mckeithan MD, [facility] 10/15/2015 2:52 PM

## 2015-10-15 NOTE — Discharge Instructions (Signed)

## 2015-10-16 ENCOUNTER — Encounter (HOSPITAL_COMMUNITY): Payer: Self-pay | Admitting: Internal Medicine

## 2015-10-16 NOTE — Telephone Encounter (Signed)
Opened in error

## 2015-10-24 ENCOUNTER — Ambulatory Visit (INDEPENDENT_AMBULATORY_CARE_PROVIDER_SITE_OTHER): Payer: Medicare Other | Admitting: *Deleted

## 2015-10-24 ENCOUNTER — Encounter: Payer: Self-pay | Admitting: Internal Medicine

## 2015-10-24 DIAGNOSIS — I495 Sick sinus syndrome: Secondary | ICD-10-CM | POA: Diagnosis not present

## 2015-10-24 NOTE — Progress Notes (Signed)
Wound check appointment. Steri-strips removed. Wound without redness or edema. Incision edges approximated, wound well healed. Normal device function. Thresholds, sensing, and impedances consistent with implant measurements. Device programmed chronic levels s/p gen change. Histogram distribution appropriate for patient and level of activity. One high ventricular rates noted, EGM shows noise on V lead. Ventricular sensitivity adjusted to 2.8. ROV with JA in 45mo.

## 2016-01-20 ENCOUNTER — Encounter: Payer: Self-pay | Admitting: Internal Medicine

## 2016-01-20 ENCOUNTER — Ambulatory Visit (INDEPENDENT_AMBULATORY_CARE_PROVIDER_SITE_OTHER): Payer: Medicare Other | Admitting: Internal Medicine

## 2016-01-20 VITALS — BP 130/80 | HR 82 | Ht 70.0 in | Wt 176.2 lb

## 2016-01-20 DIAGNOSIS — I10 Essential (primary) hypertension: Secondary | ICD-10-CM

## 2016-01-20 DIAGNOSIS — I495 Sick sinus syndrome: Secondary | ICD-10-CM | POA: Diagnosis not present

## 2016-01-20 NOTE — Progress Notes (Signed)
PCP:  Dr Dossie Arbour Primary Cardiologist:  Dr [place]  The patient presents today for routine electrophysiology followup.  Since his recent generator change, the patient reports doing very well. He remains active for his age.  Today, he denies symptoms of palpitations, chest pain, shortness of breath, orthopnea, PND, lower extremity edema, dizziness, presyncope, syncope, or neurologic sequela.  The patient feels that he is tolerating medications without difficulties and is otherwise without complaint today.   Past Medical History:  Diagnosis Date  . CAD    s/p CABG  . Diabetes mellitus   . Dyslipidemia   . Essential hypertension, benign   . Hematuria   . RBBB   . Sick sinus syndrome [facility])    s/p PPM (MDT) by Dr Reyes Ivan 2008   Past Surgical History:  Procedure Laterality Date  . Coronary artery bypass grafting.     lima-lad,svg-OM1-2,svg-rca  . Dual-chamber permanent pacemaker implant 2008  06/23/2006   MDT Adapta implanted by Dr Reyes Ivan  . EP IMPLANTABLE DEVICE N/A 10/15/2015   Procedure:  PPM Generator Changeout;  Surgeon: Hillis Range, MD;  [facility];  Service: Cardiovascular;  Laterality: N/A;  . HAND SURGERY  2004  . HERNIA REPAIR  1995   right    Current Outpatient Prescriptions  Medication Sig Dispense Refill  . aspirin EC 81 MG tablet Take 81 mg by mouth every morning.    . diltiazem (TIAZAC) 360 MG 24 hr capsule Take 360 mg by mouth daily.     Marland Kitchen lisinopril-hydrochlorothiazide (PRINZIDE,ZESTORETIC) 20-12.5 MG per tablet Take 1 tablet by mouth daily.     . metFORMIN (GLUCOPHAGE-XR) 500 MG 24 hr tablet Take 1,000 mg by mouth daily with supper.     . Multiple Vitamin (MULTI-VITAMIN PO) Take 1 tablet by mouth daily.     . pravastatin (PRAVACHOL) 40 MG tablet Take 40 mg by mouth at bedtime.     Marland Kitchen VITAMIN E PO Take 400 Units by mouth daily.      No current facility-administered medications for this visit.     No Known Allergies  Social History    Social History  . Marital status: Widowed    Spouse name: N/A  . Number of children: 2  . Years of education: N/A   Occupational History  . teacher     retired   Social History Main Topics  . Smoking status: Never Smoker  . Smokeless tobacco: Not on file  . Alcohol use No  . Drug use: No  . Sexual activity: Not on file   Other Topics Concern  . Not on file   Social History Narrative    Lives in [place] and attends [employer]   Physical Exam: Vitals:   01/20/16 1059  BP: 130/80  Pulse: 82  Weight: 176 lb 3.2 oz (79.9 kg)  Height: 5\' 10"  (1.778 m)    GEN- The patient is elderly appearing, alert and oriented x 3 today.   Head- normocephalic, atraumatic Eyes-  Sclera clear, conjunctiva pink Ears- hearing intact Oropharynx- clear Neck- supple Lungs- Clear to ausculation bilaterally, normal work of breathing Chest- pacemaker pocket is well healed Heart- Regular rate and rhythm, no murmurs, rubs or gallops GI- soft, NT, ND, + BS Extremities- no clubbing, cyanosis, or edema  Pacemaker interrogation- reviewed in detail today,  See PACEART report  Assessment and Plan:  1. Sick sinus syndrome Normal pacemaker function s/p gen change See Pace Art report Rate response adjusted today from  low to medium low V sensitivity changed from 2.8 to 4mV today for noise on V lead  RV lead impedance/ threshold bipolar were previously noted to be elevated.  4 years ago I reprogrammed unipolar for V pacing and he has done very well.  He does not pace in the ventricule.    2. CAD Stable No change required today  3. HTN Stable No change required today  Return to see EP NP in 1 year Overdue to see Dr SwazilandJordan carelink  Hillis RangeJames Mehki Klumpp MD, [facility] 01/20/2016 11:30 AM

## 2016-01-20 NOTE — Patient Instructions (Addendum)
Medication Instructions:  Your physician recommends that you continue on your current medications as directed. Please refer to the Current Medication list given to you today.   Labwork: None ordered   Testing/Procedures: None ordered   Follow-Up: Your physician wants you to follow-up in: 12 months with Amber Seiler, NP You will receive a reminder letter in the mail two months in advance. If you don't receive a letter, please call our office to schedule the follow-up appointment.   Remote monitoring is used to monitor your Pacemaker  from home. This monitoring reduces the number of office visits required to check your device to one time per year. It allows us to keep an eye on the functioning of your device to ensure it is working properly. You are scheduled for a device check from home on 04/20/16. You may send your transmission at any time that day. If you have a wireless device, the transmission will be sent automatically. After your physician reviews your transmission, you will receive a postcard with your next transmission date.      Any Other Special Instructions Will Be Listed Below (If Applicable).     If you need a refill on your cardiac medications before your next appointment, please call your pharmacy.   

## 2016-04-03 LAB — CUP PACEART INCLINIC DEVICE CHECK
Battery Remaining Longevity: 149 mo
Brady Statistic AS VS Percent: 19 %
Date Time Interrogation Session: 20171016151908
Implantable Lead Implant Date: 20080319
Implantable Lead Location: 753859
Implantable Lead Model: 5076
Implantable Pulse Generator Implant Date: 20170711
Lead Channel Pacing Threshold Amplitude: 0.75 V
Lead Channel Pacing Threshold Pulse Width: 0.4 ms
Lead Channel Sensing Intrinsic Amplitude: 8 mV
Lead Channel Setting Pacing Amplitude: 2 V
Lead Channel Setting Pacing Amplitude: 2.5 V
Lead Channel Setting Pacing Pulse Width: 0.4 ms
Lead Channel Setting Sensing Sensitivity: 4 mV
MDC IDC LEAD IMPLANT DT: 20080319
MDC IDC LEAD LOCATION: 753860
MDC IDC MSMT BATTERY IMPEDANCE: 100 Ohm
MDC IDC MSMT BATTERY VOLTAGE: 2.79 V
MDC IDC MSMT LEADCHNL RA IMPEDANCE VALUE: 462 Ohm
MDC IDC MSMT LEADCHNL RA PACING THRESHOLD AMPLITUDE: 0.75 V
MDC IDC MSMT LEADCHNL RA PACING THRESHOLD PULSEWIDTH: 0.4 ms
MDC IDC MSMT LEADCHNL RA SENSING INTR AMPL: 2.8 mV
MDC IDC MSMT LEADCHNL RV IMPEDANCE VALUE: 605 Ohm
MDC IDC STAT BRADY AP VP PERCENT: 1 %
MDC IDC STAT BRADY AP VS PERCENT: 80 %
MDC IDC STAT BRADY AS VP PERCENT: 0 %

## 2016-04-20 ENCOUNTER — Ambulatory Visit (INDEPENDENT_AMBULATORY_CARE_PROVIDER_SITE_OTHER): Payer: Medicare Other | Admitting: *Deleted

## 2016-04-20 ENCOUNTER — Telehealth: Payer: Self-pay | Admitting: Cardiology

## 2016-04-20 DIAGNOSIS — I495 Sick sinus syndrome: Secondary | ICD-10-CM

## 2016-04-20 NOTE — Telephone Encounter (Signed)
Confirmed remote transmission w/ pt wife caregiver.   

## 2016-04-21 NOTE — Progress Notes (Signed)
Remote pacemaker transmission.   

## 2016-04-24 LAB — CUP PACEART REMOTE DEVICE CHECK
Battery Impedance: 100 Ohm
Battery Remaining Longevity: 145 mo
Battery Voltage: 2.79 V
Brady Statistic AP VP Percent: 1 %
Brady Statistic AP VS Percent: 84 %
Brady Statistic AS VP Percent: 0 %
Implantable Lead Implant Date: 20080319
Implantable Lead Location: 753860
Implantable Lead Model: 5076
Implantable Lead Model: 5076
Implantable Pulse Generator Implant Date: 20170711
Lead Channel Impedance Value: 461 Ohm
Lead Channel Impedance Value: 575 Ohm
Lead Channel Pacing Threshold Amplitude: 0.625 V
Lead Channel Pacing Threshold Amplitude: 0.75 V
Lead Channel Pacing Threshold Pulse Width: 0.4 ms
Lead Channel Pacing Threshold Pulse Width: 0.4 ms
Lead Channel Setting Pacing Amplitude: 2.5 V
Lead Channel Setting Pacing Pulse Width: 0.4 ms
Lead Channel Setting Sensing Sensitivity: 2.8 mV
MDC IDC LEAD IMPLANT DT: 20080319
MDC IDC LEAD LOCATION: 753859
MDC IDC MSMT LEADCHNL RV SENSING INTR AMPL: 5.6 mV
MDC IDC SESS DTM: 20180116210256
MDC IDC SET LEADCHNL RA PACING AMPLITUDE: 2 V
MDC IDC STAT BRADY AS VS PERCENT: 15 %

## 2016-04-29 ENCOUNTER — Encounter: Payer: Self-pay | Admitting: Cardiology

## 2016-06-26 ENCOUNTER — Ambulatory Visit (INDEPENDENT_AMBULATORY_CARE_PROVIDER_SITE_OTHER): Payer: Medicare Other | Admitting: Internal Medicine

## 2016-06-26 ENCOUNTER — Encounter: Payer: Self-pay | Admitting: Internal Medicine

## 2016-06-26 VITALS — BP 122/72 | HR 86 | Ht 70.0 in | Wt 170.0 lb

## 2016-06-26 DIAGNOSIS — I10 Essential (primary) hypertension: Secondary | ICD-10-CM | POA: Diagnosis not present

## 2016-06-26 DIAGNOSIS — I495 Sick sinus syndrome: Secondary | ICD-10-CM | POA: Diagnosis not present

## 2016-06-26 DIAGNOSIS — Z950 Presence of cardiac pacemaker: Secondary | ICD-10-CM

## 2016-06-26 LAB — CUP PACEART INCLINIC DEVICE CHECK
Battery Impedance: 100 Ohm
Brady Statistic AP VP Percent: 0 %
Brady Statistic AP VS Percent: 84 %
Brady Statistic AS VS Percent: 15 %
Implantable Lead Implant Date: 20080319
Implantable Lead Location: 753859
Implantable Lead Model: 5076
Implantable Lead Model: 5076
Lead Channel Impedance Value: 687 Ohm
Lead Channel Pacing Threshold Amplitude: 0.75 V
Lead Channel Pacing Threshold Amplitude: 1 V
Lead Channel Sensing Intrinsic Amplitude: 4 mV
Lead Channel Sensing Intrinsic Amplitude: 8 mV
Lead Channel Setting Pacing Amplitude: 2 V
Lead Channel Setting Sensing Sensitivity: 2.8 mV
MDC IDC LEAD IMPLANT DT: 20080319
MDC IDC LEAD LOCATION: 753860
MDC IDC MSMT BATTERY REMAINING LONGEVITY: 146 mo
MDC IDC MSMT BATTERY VOLTAGE: 2.79 V
MDC IDC MSMT LEADCHNL RA IMPEDANCE VALUE: 495 Ohm
MDC IDC MSMT LEADCHNL RA PACING THRESHOLD PULSEWIDTH: 0.4 ms
MDC IDC MSMT LEADCHNL RV PACING THRESHOLD PULSEWIDTH: 0.4 ms
MDC IDC PG IMPLANT DT: 20170711
MDC IDC SESS DTM: 20180323150216
MDC IDC SET LEADCHNL RV PACING AMPLITUDE: 2.5 V
MDC IDC SET LEADCHNL RV PACING PULSEWIDTH: 0.4 ms
MDC IDC STAT BRADY AS VP PERCENT: 0 %

## 2016-06-26 NOTE — Progress Notes (Signed)
PCP:  Dr Dossie Arbour Primary Cardiologist:  Dr [place]  The patient presents today for routine electrophysiology followup.  Since his last visit, the patient reports doing very well. He remains active for his age. His wife has dementia and he cares for her.  Today, he denies symptoms of palpitations, chest pain, shortness of breath, orthopnea, PND, lower extremity edema, dizziness, presyncope, syncope, or neurologic sequela.  The patient feels that he is tolerating medications without difficulties and is otherwise without complaint today.   Past Medical History:  Diagnosis Date  . CAD    s/p CABG  . Diabetes mellitus   . Dyslipidemia   . Essential hypertension, benign   . Hematuria   . RBBB   . Sick sinus syndrome [facility])    s/p PPM (MDT) by Dr Reyes Ivan 2008   Past Surgical History:  Procedure Laterality Date  . Coronary artery bypass grafting.     lima-lad,svg-OM1-2,svg-rca  . Dual-chamber permanent pacemaker implant 2008  06/23/2006   MDT Adapta implanted by Dr Reyes Ivan  . EP IMPLANTABLE DEVICE N/A 10/15/2015   Procedure:  PPM Generator Changeout;  Surgeon: Hillis Range, MD;  [facility];  Service: Cardiovascular;  Laterality: N/A;  . HAND SURGERY  2004  . HERNIA REPAIR  1995   right    Current Outpatient Prescriptions  Medication Sig Dispense Refill  . aspirin EC 81 MG tablet Take 81 mg by mouth every morning.    . diltiazem (TIAZAC) 360 MG 24 hr capsule Take 360 mg by mouth daily.     Marland Kitchen lisinopril-hydrochlorothiazide (PRINZIDE,ZESTORETIC) 20-12.5 MG per tablet Take 1 tablet by mouth daily.     . metFORMIN (GLUCOPHAGE-XR) 500 MG 24 hr tablet Take 1,000 mg by mouth daily with supper.     . Multiple Vitamin (MULTI-VITAMIN PO) Take 1 tablet by mouth daily.     . pravastatin (PRAVACHOL) 40 MG tablet Take 40 mg by mouth at bedtime.     Marland Kitchen VITAMIN E PO Take 400 Units by mouth daily.      No current facility-administered medications for this visit.     No Known  Allergies  Social History   Social History  . Marital status: Widowed    Spouse name: N/A  . Number of children: 2  . Years of education: N/A   Occupational History  . teacher     retired   Social History Main Topics  . Smoking status: Never Smoker  . Smokeless tobacco: Never Used  . Alcohol use No  . Drug use: No  . Sexual activity: Not on file   Other Topics Concern  . Not on file   Social History Narrative    Lives in [place] and attends [employer]   Physical Exam: Vitals:   06/26/16 1200  BP: 122/72  Pulse: 86  SpO2: 97%  Weight: 170 lb (77.1 kg)  Height: 5\' 10"  (1.778 m)    GEN- The patient is elderly appearing,  NAD, healthy. alert and oriented x 3 today.   Head- normocephalic, atraumatic Eyes-  Sclera clear, conjunctiva pink Ears- hearing intact Oropharynx- clear Neck- supple Lungs- Clear to ausculation bilaterally, normal work of breathing Chest- pacemaker pocket is well healed Heart- Regular rate and rhythm, no murmurs, rubs or gallops GI- soft, NT, ND, + BS Extremities- no clubbing, cyanosis, or edema  Pacemaker interrogation- reviewed in detail today,  See PACEART report  Assessment and Plan:  1. Sick sinus syndrome Normal pacemaker function s/p  gen change See Arita Missace Art report Chronic V noise noted.  He does not V pace.  No indication for replacement  2. CAD Stable No change required today  3. HTN Stable No change required today  Return to see EP NP in 1 year Overdue to see Dr SwazilandJordan carelink  Hillis RangeJames Nuno Brubacher MD, [facility] 06/26/2016 12:29 PM

## 2016-06-26 NOTE — Patient Instructions (Signed)
Medication Instructions: Your physician recommends that you continue on your current medications as directed. Please refer to the Current Medication list given to you today.  Labwork: None Ordered  Procedures/Testing: None Ordered  Follow-Up: Remote monitoring is used to monitor your Pacemaker from home. This monitoring reduces the number of office visits required to check your device to one time per year. It allows us to keep an eye on the functioning of your device to ensure it is working properly. You are scheduled for a device check from home on 09/28/16. You may send your transmission at any time that day. If you have a wireless device, the transmission will be sent automatically. After your physician reviews your transmission, you will receive a postcard with your next transmission date.  Your physician wants you to follow-up in: 1 YEAR with Gypsy BalsamAmber Seiler, NP.  You will receive a reminder letter in the mail two months in advance. If you don't receive a letter, please call our office to schedule the follow-up appointment.   Any Additional Special Instructions Will Be Listed Below (If Applicable).     If you need a refill on your cardiac medications before your next appointment, please call your pharmacy.

## 2016-07-20 ENCOUNTER — Telehealth: Payer: Self-pay | Admitting: Cardiology

## 2016-07-20 ENCOUNTER — Encounter: Payer: Medicare Other | Admitting: *Deleted

## 2016-07-20 NOTE — Telephone Encounter (Signed)
Confirmed remote transmission w/ pt wife.   

## 2016-07-24 ENCOUNTER — Encounter: Payer: Self-pay | Admitting: Cardiology

## 2016-09-28 ENCOUNTER — Telehealth: Payer: Self-pay | Admitting: Cardiology

## 2016-09-28 ENCOUNTER — Ambulatory Visit (INDEPENDENT_AMBULATORY_CARE_PROVIDER_SITE_OTHER): Payer: Medicare Other | Admitting: *Deleted

## 2016-09-28 DIAGNOSIS — I495 Sick sinus syndrome: Secondary | ICD-10-CM | POA: Diagnosis not present

## 2016-09-28 NOTE — Telephone Encounter (Signed)
Confirmed remote transmission w/ pt caregiver.   

## 2016-09-29 NOTE — Progress Notes (Signed)
Remote pacemaker transmission.   

## 2016-09-30 ENCOUNTER — Encounter: Payer: Self-pay | Admitting: Cardiology

## 2016-10-05 LAB — CUP PACEART REMOTE DEVICE CHECK
Battery Impedance: 100 Ohm
Battery Voltage: 2.79 V
Brady Statistic AP VP Percent: 0 %
Brady Statistic AP VS Percent: 90 %
Brady Statistic AS VP Percent: 0 %
Brady Statistic AS VS Percent: 9 %
Implantable Lead Location: 753860
Implantable Lead Model: 5076
Implantable Lead Model: 5076
Lead Channel Impedance Value: 449 Ohm
Lead Channel Impedance Value: 603 Ohm
Lead Channel Pacing Threshold Amplitude: 0.875 V
Lead Channel Pacing Threshold Pulse Width: 0.4 ms
Lead Channel Setting Pacing Amplitude: 2.5 V
Lead Channel Setting Pacing Pulse Width: 0.4 ms
MDC IDC LEAD IMPLANT DT: 20080319
MDC IDC LEAD IMPLANT DT: 20080319
MDC IDC LEAD LOCATION: 753859
MDC IDC MSMT BATTERY REMAINING LONGEVITY: 144 mo
MDC IDC MSMT LEADCHNL RV PACING THRESHOLD AMPLITUDE: 0.625 V
MDC IDC MSMT LEADCHNL RV PACING THRESHOLD PULSEWIDTH: 0.4 ms
MDC IDC PG IMPLANT DT: 20170711
MDC IDC SESS DTM: 20180625192900
MDC IDC SET LEADCHNL RA PACING AMPLITUDE: 2 V
MDC IDC SET LEADCHNL RV SENSING SENSITIVITY: 2.8 mV

## 2016-12-28 ENCOUNTER — Ambulatory Visit (INDEPENDENT_AMBULATORY_CARE_PROVIDER_SITE_OTHER): Payer: Medicare Other | Admitting: *Deleted

## 2016-12-28 DIAGNOSIS — I495 Sick sinus syndrome: Secondary | ICD-10-CM | POA: Diagnosis not present

## 2016-12-29 LAB — CUP PACEART REMOTE DEVICE CHECK
Battery Impedance: 113 Ohm
Battery Voltage: 2.79 V
Brady Statistic AP VS Percent: 91 %
Brady Statistic AS VS Percent: 9 %
Date Time Interrogation Session: 20180924141503
Implantable Lead Implant Date: 20080319
Implantable Lead Location: 753859
Implantable Lead Model: 5076
Lead Channel Impedance Value: 680 Ohm
Lead Channel Pacing Threshold Pulse Width: 0.4 ms
Lead Channel Setting Pacing Amplitude: 2 V
Lead Channel Setting Pacing Amplitude: 2.5 V
Lead Channel Setting Pacing Pulse Width: 0.4 ms
Lead Channel Setting Sensing Sensitivity: 2 mV
MDC IDC LEAD IMPLANT DT: 20080319
MDC IDC LEAD LOCATION: 753860
MDC IDC MSMT BATTERY REMAINING LONGEVITY: 142 mo
MDC IDC MSMT LEADCHNL RA IMPEDANCE VALUE: 497 Ohm
MDC IDC MSMT LEADCHNL RA PACING THRESHOLD AMPLITUDE: 0.875 V
MDC IDC MSMT LEADCHNL RA PACING THRESHOLD PULSEWIDTH: 0.4 ms
MDC IDC MSMT LEADCHNL RV PACING THRESHOLD AMPLITUDE: 0.75 V
MDC IDC PG IMPLANT DT: 20170711
MDC IDC STAT BRADY AP VP PERCENT: 0 %
MDC IDC STAT BRADY AS VP PERCENT: 0 %

## 2016-12-29 NOTE — Progress Notes (Signed)
Remote pacemaker transmission.   

## 2016-12-31 ENCOUNTER — Encounter: Payer: Self-pay | Admitting: Cardiology

## 2017-01-26 NOTE — Progress Notes (Signed)
Electrophysiology Office Note Date: 01/27/2017  ID:  Peter Reynolds, DOB 27-Feb-1929, MRN [id number]  PCP: Jarome Matin, MD Primary Cardiologist: [place] Electrophysiologist: Allred  CC: Pacemaker follow-up  Peter Reynolds is a 83 y.o. male seen today for Dr Johney Reynolds.  He presents today for routine electrophysiology followup.  Since last being seen in our clinic, the patient reports doing very well.  He denies chest pain, palpitations, dyspnea, PND, orthopnea, nausea, vomiting, dizziness, syncope, edema, weight gain, or early satiety.  Device History: MDT dual chamber PPM implanted 2008 for SSS; gen change 2017   Past Medical History:  Diagnosis Date  . CAD    s/p CABG  . Diabetes mellitus   . Dyslipidemia   . Essential hypertension, benign   . Hematuria   . RBBB   . Sick sinus syndrome [facility])    s/p PPM (MDT) by Dr Reyes Ivan 2008   Past Surgical History:  Procedure Laterality Date  . Coronary artery bypass grafting.     lima-lad,svg-OM1-2,svg-rca  . Dual-chamber permanent pacemaker implant 2008  06/23/2006   MDT Adapta implanted by Dr Reyes Ivan  . EP IMPLANTABLE DEVICE N/A 10/15/2015   Procedure:  PPM Generator Changeout;  Surgeon: Hillis Range, MD;  [facility];  Service: Cardiovascular;  Laterality: N/A;  . HAND SURGERY  2004  . HERNIA REPAIR  1995   right    Current Outpatient Prescriptions  Medication Sig Dispense Refill  . aspirin EC 81 MG tablet Take 81 mg by mouth every morning.    . diltiazem (TIAZAC) 360 MG 24 hr capsule Take 360 mg by mouth daily.     Marland Kitchen lisinopril-hydrochlorothiazide (PRINZIDE,ZESTORETIC) 20-12.5 MG per tablet Take 1 tablet by mouth daily.     . metFORMIN (GLUCOPHAGE-XR) 500 MG 24 hr tablet Take 1,000 mg by mouth daily with supper.     . Multiple Vitamin (MULTI-VITAMIN PO) Take 1 tablet by mouth daily.     . pravastatin (PRAVACHOL) 40 MG tablet Take 40 mg by mouth at bedtime.     Marland Kitchen VITAMIN E PO Take 400  Units by mouth daily.      No current facility-administered medications for this visit.     Allergies:   Patient has no known allergies.   Social History: Social History   Social History  . Marital status: Widowed    Spouse name: N/A  . Number of children: 2  . Years of education: N/A   Occupational History  . teacher     retired   Social History Main Topics  . Smoking status: Never Smoker  . Smokeless tobacco: Never Used  . Alcohol use No  . Drug use: No  . Sexual activity: Not on file   Other Topics Concern  . Not on file   Social History Narrative    Lives in [place] and attends [employer]    Family History: Family History  Problem Relation Age of Onset  . Hypertension Mother   . Heart disease Father   . Heart disease Brother      Review of Systems: All other systems reviewed and are otherwise negative except as noted above.   Physical Exam: VS:  BP (!) 156/74   Pulse 83   Ht 5\' 10"  (1.778 m)   Wt 172 lb 6.4 oz (78.2 kg)   SpO2 94%   BMI 24.74 kg/m  , BMI Body mass index is 24.74 kg/m.  GEN- The patient is well appearing, alert and  oriented x 3 today.   HEENT: normocephalic, atraumatic; sclera clear, conjunctiva pink; hearing intact; oropharynx clear; neck supple  Lungs- Clear to ausculation bilaterally, normal work of breathing.  No wheezes, rales, rhonchi Heart- Regular rate and rhythm, no murmurs, rubs or gallops  GI- soft, non-tender, non-distended, bowel sounds present  Extremities- no clubbing, cyanosis, or edema  MS- no significant deformity or atrophy Skin- warm and dry, no rash or lesion; PPM pocket well healed Psych- euthymic mood, full affect Neuro- strength and sensation are intact  PPM Interrogation- reviewed in detail today,  See PACEART report  EKG:  EKG is not ordered today.  Recent Labs: No results found for requested labs within last 8760 hours.   Wt Readings from Last 3 Encounters:  01/27/17 172  lb 6.4 oz (78.2 kg)  06/26/16 170 lb (77.1 kg)  01/20/16 176 lb 3.2 oz (79.9 kg)     Other studies Reviewed: Additional studies/ records that were reviewed today include: Dr Jenel LucksAllred's office notes   Assessment and Plan:  1.  Symptomatic bradycardia Normal PPM function See Pace Art report No changes today Known noise on V lead. Stable, he does not V pace. Will follow   2.  CAD s/p CABG No recent ischemic symptoms Continue medical therapy Lipids followed by PCP  3.  HTN BP slightly elevated today He ate [place] last night for his birthday I asked him to follow blood pressures at home and call us with persistently elevated readings.   Current medicines are reviewed at length with the patient today.   The patient does not have concerns regarding his medicines.  The following changes were made today:  none  Labs/ tests ordered today include: none Orders Placed This Encounter  Procedures  . EKG 12-Lead     Disposition:   Follow up with Carelink, me in 1 year     Signed, Gypsy BalsamAmber Natajah Derderian, NP 01/27/2017 11:22 AM  [facility] [address] [place] [address] [phone] (office) [phone] (fax)

## 2017-01-27 ENCOUNTER — Encounter: Payer: Self-pay | Admitting: Nurse Practitioner

## 2017-01-27 ENCOUNTER — Ambulatory Visit (INDEPENDENT_AMBULATORY_CARE_PROVIDER_SITE_OTHER): Payer: Medicare Other | Admitting: Nurse Practitioner

## 2017-01-27 VITALS — BP 156/74 | HR 83 | Ht 70.0 in | Wt 172.4 lb

## 2017-01-27 DIAGNOSIS — I10 Essential (primary) hypertension: Secondary | ICD-10-CM

## 2017-01-27 DIAGNOSIS — I25810 Atherosclerosis of coronary artery bypass graft(s) without angina pectoris: Secondary | ICD-10-CM | POA: Diagnosis not present

## 2017-01-27 DIAGNOSIS — I495 Sick sinus syndrome: Secondary | ICD-10-CM

## 2017-01-27 NOTE — Patient Instructions (Addendum)
Medication Instructions:   Your physician recommends that you continue on your current medications as directed. Please refer to the Current Medication list given to you today.   If you need a refill on your cardiac medications before your next appointment, please call your pharmacy.  Labwork: NONE ORDERED  TODAY    Testing/Procedures: NONE ORDERED  TODAY    Follow-Up: Your physician wants you to follow-up in: ONE YEAR WITH  SEILER   You will receive a reminder letter in the mail two months in advance. If you don't receive a letter, please call our office to schedule the follow-up appointment.     Any Other Special Instructions Will Be Listed Below (If Applicable).                                                                                                                                                   

## 2017-01-28 LAB — CUP PACEART INCLINIC DEVICE CHECK
Date Time Interrogation Session: 20181025080018
Implantable Lead Implant Date: 20080319
Implantable Lead Location: 753859
Implantable Lead Model: 5076
Implantable Lead Model: 5076
MDC IDC LEAD IMPLANT DT: 20080319
MDC IDC LEAD LOCATION: 753860
MDC IDC PG IMPLANT DT: 20170711

## 2017-03-24 ENCOUNTER — Encounter: Payer: Self-pay | Admitting: Internal Medicine

## 2017-03-31 ENCOUNTER — Telehealth: Payer: Self-pay | Admitting: Cardiology

## 2017-03-31 ENCOUNTER — Ambulatory Visit (INDEPENDENT_AMBULATORY_CARE_PROVIDER_SITE_OTHER): Payer: Medicare Other | Admitting: *Deleted

## 2017-03-31 DIAGNOSIS — I495 Sick sinus syndrome: Secondary | ICD-10-CM | POA: Diagnosis not present

## 2017-03-31 NOTE — Telephone Encounter (Signed)
LMOVM reminding pt to send remote transmission.   

## 2017-04-01 ENCOUNTER — Encounter: Payer: Self-pay | Admitting: Cardiology

## 2017-04-01 LAB — CUP PACEART REMOTE DEVICE CHECK
Battery Impedance: 113 Ohm
Battery Remaining Longevity: 139 mo
Battery Voltage: 2.79 V
Brady Statistic AP VP Percent: 0 %
Brady Statistic AS VP Percent: 0 %
Implantable Lead Implant Date: 20080319
Implantable Lead Model: 5076
Implantable Lead Model: 5076
Implantable Pulse Generator Implant Date: 20170711
Lead Channel Impedance Value: 448 Ohm
Lead Channel Pacing Threshold Amplitude: 0.75 V
Lead Channel Pacing Threshold Pulse Width: 0.4 ms
Lead Channel Setting Pacing Amplitude: 2.5 V
Lead Channel Setting Pacing Pulse Width: 0.4 ms
MDC IDC LEAD IMPLANT DT: 20080319
MDC IDC LEAD LOCATION: 753859
MDC IDC LEAD LOCATION: 753860
MDC IDC MSMT LEADCHNL RV IMPEDANCE VALUE: 603 Ohm
MDC IDC MSMT LEADCHNL RV PACING THRESHOLD AMPLITUDE: 0.75 V
MDC IDC MSMT LEADCHNL RV PACING THRESHOLD PULSEWIDTH: 0.4 ms
MDC IDC MSMT LEADCHNL RV SENSING INTR AMPL: 5.6 mV
MDC IDC SESS DTM: 20181226202055
MDC IDC SET LEADCHNL RA PACING AMPLITUDE: 2 V
MDC IDC SET LEADCHNL RV SENSING SENSITIVITY: 2.8 mV
MDC IDC STAT BRADY AP VS PERCENT: 92 %
MDC IDC STAT BRADY AS VS PERCENT: 8 %

## 2017-04-01 NOTE — Progress Notes (Signed)
Remote pacemaker transmission.   

## 2017-06-30 ENCOUNTER — Telehealth: Payer: Self-pay | Admitting: Cardiology

## 2017-06-30 ENCOUNTER — Ambulatory Visit (INDEPENDENT_AMBULATORY_CARE_PROVIDER_SITE_OTHER): Payer: Medicare Other | Admitting: *Deleted

## 2017-06-30 DIAGNOSIS — I495 Sick sinus syndrome: Secondary | ICD-10-CM | POA: Diagnosis not present

## 2017-06-30 NOTE — Progress Notes (Signed)
Remote pacemaker transmission.   

## 2017-06-30 NOTE — Telephone Encounter (Signed)
Spoke with pt and reminded pt of remote transmission that is due today. Pt verbalized understanding.   

## 2017-07-05 ENCOUNTER — Encounter: Payer: Self-pay | Admitting: Cardiology

## 2017-07-09 LAB — CUP PACEART REMOTE DEVICE CHECK
Battery Remaining Longevity: 138 mo
Battery Voltage: 2.79 V
Brady Statistic AP VS Percent: 91 %
Brady Statistic AS VP Percent: 0 %
Implantable Lead Location: 753860
Implantable Lead Model: 5076
Implantable Pulse Generator Implant Date: 20170711
Lead Channel Pacing Threshold Amplitude: 0.75 V
Lead Channel Pacing Threshold Amplitude: 0.875 V
Lead Channel Pacing Threshold Pulse Width: 0.4 ms
Lead Channel Setting Pacing Amplitude: 2 V
Lead Channel Setting Pacing Pulse Width: 0.4 ms
Lead Channel Setting Sensing Sensitivity: 2.8 mV
MDC IDC LEAD IMPLANT DT: 20080319
MDC IDC LEAD IMPLANT DT: 20080319
MDC IDC LEAD LOCATION: 753859
MDC IDC MSMT BATTERY IMPEDANCE: 113 Ohm
MDC IDC MSMT LEADCHNL RA IMPEDANCE VALUE: 431 Ohm
MDC IDC MSMT LEADCHNL RA PACING THRESHOLD PULSEWIDTH: 0.4 ms
MDC IDC MSMT LEADCHNL RV IMPEDANCE VALUE: 663 Ohm
MDC IDC SESS DTM: 20190327190335
MDC IDC SET LEADCHNL RV PACING AMPLITUDE: 2.5 V
MDC IDC STAT BRADY AP VP PERCENT: 1 %
MDC IDC STAT BRADY AS VS PERCENT: 8 %

## 2017-09-29 ENCOUNTER — Telehealth: Payer: Self-pay

## 2017-09-29 ENCOUNTER — Ambulatory Visit (INDEPENDENT_AMBULATORY_CARE_PROVIDER_SITE_OTHER): Payer: Medicare Other | Admitting: *Deleted

## 2017-09-29 DIAGNOSIS — I495 Sick sinus syndrome: Secondary | ICD-10-CM | POA: Diagnosis not present

## 2017-09-29 NOTE — Telephone Encounter (Signed)
Confirmed remote transmission w/ pt wife.   

## 2017-09-30 ENCOUNTER — Encounter: Payer: Self-pay | Admitting: Cardiology

## 2017-09-30 LAB — CUP PACEART REMOTE DEVICE CHECK
Battery Voltage: 2.79 V
Brady Statistic AP VS Percent: 89 %
Brady Statistic AS VP Percent: 0 %
Brady Statistic AS VS Percent: 9 %
Implantable Lead Implant Date: 20080319
Implantable Lead Location: 753859
Implantable Lead Model: 5076
Implantable Lead Model: 5076
Lead Channel Impedance Value: 601 Ohm
Lead Channel Pacing Threshold Amplitude: 0.75 V
Lead Channel Pacing Threshold Pulse Width: 0.4 ms
Lead Channel Pacing Threshold Pulse Width: 0.4 ms
Lead Channel Setting Sensing Sensitivity: 2 mV
MDC IDC LEAD IMPLANT DT: 20080319
MDC IDC LEAD LOCATION: 753860
MDC IDC MSMT BATTERY IMPEDANCE: 137 Ohm
MDC IDC MSMT BATTERY REMAINING LONGEVITY: 133 mo
MDC IDC MSMT LEADCHNL RA IMPEDANCE VALUE: 448 Ohm
MDC IDC MSMT LEADCHNL RV PACING THRESHOLD AMPLITUDE: 0.75 V
MDC IDC PG IMPLANT DT: 20170711
MDC IDC SESS DTM: 20190626184944
MDC IDC SET LEADCHNL RA PACING AMPLITUDE: 2 V
MDC IDC SET LEADCHNL RV PACING AMPLITUDE: 2.5 V
MDC IDC SET LEADCHNL RV PACING PULSEWIDTH: 0.4 ms
MDC IDC STAT BRADY AP VP PERCENT: 2 %

## 2017-09-30 NOTE — Progress Notes (Signed)
Remote pacemaker transmission.   

## 2017-12-29 ENCOUNTER — Ambulatory Visit (INDEPENDENT_AMBULATORY_CARE_PROVIDER_SITE_OTHER): Payer: Medicare Other | Admitting: *Deleted

## 2017-12-29 ENCOUNTER — Telehealth: Payer: Self-pay

## 2017-12-29 DIAGNOSIS — I495 Sick sinus syndrome: Secondary | ICD-10-CM

## 2017-12-29 NOTE — Telephone Encounter (Signed)
LMOVM reminding pt to send remote transmission.   

## 2017-12-30 NOTE — Progress Notes (Signed)
Remote pacemaker transmission.   

## 2017-12-31 ENCOUNTER — Encounter: Payer: Self-pay | Admitting: Cardiology

## 2018-01-18 LAB — CUP PACEART REMOTE DEVICE CHECK
Battery Impedance: 161 Ohm
Battery Remaining Longevity: 127 mo
Battery Voltage: 2.79 V
Brady Statistic AP VS Percent: 90 %
Brady Statistic AS VP Percent: 0 %
Implantable Lead Implant Date: 20080319
Implantable Lead Location: 753859
Implantable Lead Model: 5076
Lead Channel Pacing Threshold Amplitude: 0.625 V
Lead Channel Pacing Threshold Pulse Width: 0.4 ms
MDC IDC LEAD IMPLANT DT: 20080319
MDC IDC LEAD LOCATION: 753860
MDC IDC MSMT LEADCHNL RA IMPEDANCE VALUE: 443 Ohm
MDC IDC MSMT LEADCHNL RV IMPEDANCE VALUE: 550 Ohm
MDC IDC MSMT LEADCHNL RV PACING THRESHOLD AMPLITUDE: 0.625 V
MDC IDC MSMT LEADCHNL RV PACING THRESHOLD PULSEWIDTH: 0.4 ms
MDC IDC PG IMPLANT DT: 20170711
MDC IDC SESS DTM: 20190925184834
MDC IDC SET LEADCHNL RA PACING AMPLITUDE: 2 V
MDC IDC SET LEADCHNL RV PACING AMPLITUDE: 2.5 V
MDC IDC SET LEADCHNL RV PACING PULSEWIDTH: 0.4 ms
MDC IDC SET LEADCHNL RV SENSING SENSITIVITY: 2 mV
MDC IDC STAT BRADY AP VP PERCENT: 1 %
MDC IDC STAT BRADY AS VS PERCENT: 9 %

## 2018-03-31 ENCOUNTER — Ambulatory Visit (INDEPENDENT_AMBULATORY_CARE_PROVIDER_SITE_OTHER): Payer: Medicare Other

## 2018-03-31 DIAGNOSIS — I495 Sick sinus syndrome: Secondary | ICD-10-CM

## 2018-03-31 LAB — CUP PACEART REMOTE DEVICE CHECK
Battery Impedance: 161 Ohm
Battery Remaining Longevity: 128 mo
Brady Statistic AP VP Percent: 1 %
Brady Statistic AS VP Percent: 0 %
Brady Statistic AS VS Percent: 10 %
Implantable Lead Implant Date: 20080319
Implantable Lead Location: 753860
Implantable Lead Model: 5076
Implantable Pulse Generator Implant Date: 20170711
Lead Channel Impedance Value: 455 Ohm
Lead Channel Impedance Value: 548 Ohm
Lead Channel Pacing Threshold Amplitude: 0.75 V
Lead Channel Pacing Threshold Amplitude: 0.75 V
Lead Channel Setting Pacing Amplitude: 2.5 V
Lead Channel Setting Pacing Pulse Width: 0.4 ms
MDC IDC LEAD IMPLANT DT: 20080319
MDC IDC LEAD LOCATION: 753859
MDC IDC MSMT BATTERY VOLTAGE: 2.79 V
MDC IDC MSMT LEADCHNL RA PACING THRESHOLD PULSEWIDTH: 0.4 ms
MDC IDC MSMT LEADCHNL RV PACING THRESHOLD PULSEWIDTH: 0.4 ms
MDC IDC SESS DTM: 20191226141459
MDC IDC SET LEADCHNL RA PACING AMPLITUDE: 2 V
MDC IDC SET LEADCHNL RV SENSING SENSITIVITY: 2 mV
MDC IDC STAT BRADY AP VS PERCENT: 89 %

## 2018-03-31 NOTE — Progress Notes (Signed)
Remote pacemaker transmission.   

## 2018-04-05 ENCOUNTER — Encounter: Payer: Self-pay | Admitting: Cardiology

## 2018-06-30 ENCOUNTER — Other Ambulatory Visit: Payer: Self-pay

## 2018-06-30 ENCOUNTER — Ambulatory Visit (INDEPENDENT_AMBULATORY_CARE_PROVIDER_SITE_OTHER): Payer: Medicare Other | Admitting: *Deleted

## 2018-06-30 DIAGNOSIS — I495 Sick sinus syndrome: Secondary | ICD-10-CM | POA: Diagnosis not present

## 2018-06-30 LAB — CUP PACEART REMOTE DEVICE CHECK
Battery Impedance: 161 Ohm
Battery Remaining Longevity: 128 mo
Battery Voltage: 2.79 V
Brady Statistic AP VS Percent: 89 %
Brady Statistic AS VP Percent: 0 %
Implantable Lead Implant Date: 20080319
Implantable Lead Location: 753860
Implantable Lead Model: 5076
Implantable Lead Model: 5076
Lead Channel Pacing Threshold Amplitude: 0.75 V
Lead Channel Pacing Threshold Pulse Width: 0.4 ms
Lead Channel Setting Pacing Amplitude: 2 V
Lead Channel Setting Pacing Amplitude: 2.5 V
Lead Channel Setting Pacing Pulse Width: 0.4 ms
MDC IDC LEAD IMPLANT DT: 20080319
MDC IDC LEAD LOCATION: 753859
MDC IDC MSMT LEADCHNL RA IMPEDANCE VALUE: 462 Ohm
MDC IDC MSMT LEADCHNL RV IMPEDANCE VALUE: 628 Ohm
MDC IDC MSMT LEADCHNL RV PACING THRESHOLD AMPLITUDE: 0.625 V
MDC IDC MSMT LEADCHNL RV PACING THRESHOLD PULSEWIDTH: 0.4 ms
MDC IDC PG IMPLANT DT: 20170711
MDC IDC SESS DTM: 20200326135103
MDC IDC SET LEADCHNL RV SENSING SENSITIVITY: 2 mV
MDC IDC STAT BRADY AP VP PERCENT: 1 %
MDC IDC STAT BRADY AS VS PERCENT: 10 %

## 2018-07-07 ENCOUNTER — Encounter: Payer: Self-pay | Admitting: Cardiology

## 2018-07-07 NOTE — Progress Notes (Signed)
Remote pacemaker transmission.   

## 2018-09-29 ENCOUNTER — Ambulatory Visit (INDEPENDENT_AMBULATORY_CARE_PROVIDER_SITE_OTHER): Payer: Medicare Other | Admitting: *Deleted

## 2018-09-29 DIAGNOSIS — I495 Sick sinus syndrome: Secondary | ICD-10-CM

## 2018-09-30 LAB — CUP PACEART REMOTE DEVICE CHECK
Battery Impedance: 185 Ohm
Battery Remaining Longevity: 123 mo
Battery Voltage: 2.79 V
Brady Statistic AP VP Percent: 2 %
Brady Statistic AP VS Percent: 88 %
Brady Statistic AS VP Percent: 0 %
Brady Statistic AS VS Percent: 10 %
Date Time Interrogation Session: 20200626020246
Implantable Lead Implant Date: 20080319
Implantable Lead Implant Date: 20080319
Implantable Lead Location: 753859
Implantable Lead Location: 753860
Implantable Lead Model: 5076
Implantable Lead Model: 5076
Implantable Pulse Generator Implant Date: 20170711
Lead Channel Impedance Value: 442 Ohm
Lead Channel Impedance Value: 643 Ohm
Lead Channel Pacing Threshold Amplitude: 0.5 V
Lead Channel Pacing Threshold Amplitude: 0.75 V
Lead Channel Pacing Threshold Pulse Width: 0.4 ms
Lead Channel Pacing Threshold Pulse Width: 0.4 ms
Lead Channel Setting Pacing Amplitude: 2 V
Lead Channel Setting Pacing Amplitude: 2.5 V
Lead Channel Setting Pacing Pulse Width: 0.4 ms
Lead Channel Setting Sensing Sensitivity: 2 mV

## 2018-10-10 ENCOUNTER — Encounter: Payer: Self-pay | Admitting: Cardiology

## 2018-10-10 NOTE — Progress Notes (Signed)
Remote pacemaker transmission.   

## 2018-11-22 LAB — HM DIABETES EYE EXAM

## 2018-11-24 NOTE — Progress Notes (Signed)
Electrophysiology Office Note Date: 11/25/2018  ID:  Peter Reynolds, DOB May 16, 1928, MRN [id number]  PCP: Leanna Battles, MD Primary Cardiologist: [place] Electrophysiologist: Allred  CC: Pacemaker follow-up  Peter Reynolds is a 85 y.o. male seen today for Dr Rayann Heman.  He presents today for routine electrophysiology followup.  Since last being seen in our clinic, the patient reports doing very well.  He denies chest pain, palpitations, dyspnea, PND, orthopnea, nausea, vomiting, dizziness, syncope, edema, weight gain, or early satiety.He continues to walk a mile daily.   Device History: MDT dual chamber PPM implanted 2008 for SSS; gen change 2017   Past Medical History:  Diagnosis Date  . CAD    s/p CABG  . Diabetes mellitus   . Dyslipidemia   . Essential hypertension, benign   . Hematuria   . RBBB   . Sick sinus syndrome [facility])    s/p PPM (MDT) by Dr Verlon Setting 2008   Past Surgical History:  Procedure Laterality Date  . Coronary artery bypass grafting.     lima-lad,svg-OM1-2,svg-rca  . Dual-chamber permanent pacemaker implant 2008  06/23/2006   MDT Adapta implanted by Dr Verlon Setting  . EP IMPLANTABLE DEVICE N/A 10/15/2015   Procedure:  PPM Generator Changeout;  Surgeon: Thompson Grayer, MD;  Location: [place] CV LAB;  Service: Cardiovascular;  Laterality: N/A;  . HAND SURGERY  2004  . HERNIA REPAIR  1995   right    Current Outpatient Medications  Medication Sig Dispense Refill  . aspirin EC 81 MG tablet Take 81 mg by mouth every morning.    . diltiazem (TIAZAC) 360 MG 24 hr capsule Take 360 mg by mouth daily.     Marland Kitchen lisinopril-hydrochlorothiazide (PRINZIDE,ZESTORETIC) 20-12.5 MG per tablet Take 1 tablet by mouth daily.     . metFORMIN (GLUCOPHAGE-XR) 500 MG 24 hr tablet Take 1,000 mg by mouth daily with supper.     . Multiple Vitamin (MULTI-VITAMIN PO) Take 1 tablet by mouth daily.     . pravastatin (PRAVACHOL) 40 MG tablet Take 40 mg by mouth at  bedtime.     Marland Kitchen VITAMIN E PO Take 400 Units by mouth daily.      No current facility-administered medications for this visit.     Allergies:   Patient has no known allergies.   Social History: Social History   Socioeconomic History  . Marital status: Widowed    Spouse name: Not on file  . Number of children: 2  . Years of education: Not on file  . Highest education level: Not on file  Occupational History  . Occupation: Pharmacist, hospital    Comment: retired  Scientific laboratory technician  . Financial resource strain: Not on file  . Food insecurity    Worry: Not on file    Inability: Not on file  . Transportation needs    Medical: Not on file    Non-medical: Not on file  Tobacco Use  . Smoking status: Never Smoker  . Smokeless tobacco: Never Used  Substance and Sexual Activity  . Alcohol use: No  . Drug use: No  . Sexual activity: Not on file  Lifestyle  . Physical activity    Days per week: Not on file    Minutes per session: Not on file  . Stress: Not on file  Relationships  . Social Herbalist on phone: Not on file    Gets together: Not on file    Attends religious service: Not on file  Active member of club or organization: Not on file    Attends meetings of clubs or organizations: Not on file    Relationship status: Not on file  . Intimate partner violence    Fear of current or ex partner: Not on file    Emotionally abused: Not on file    Physically abused: Not on file    Forced sexual activity: Not on file  Other Topics Concern  . Not on file  Social History Narrative    [place] and attends [employer]    Family History: Family History  Problem Relation Age of Onset  . Hypertension Mother   . Heart disease Father   . Heart disease Brother      Review of Systems: All other systems reviewed and are otherwise negative except as noted above.   Physical Exam: VS:  BP 118/60   Pulse 79   Ht 5\' 10"  (1.778 m)   Wt 170 lb (77.1  kg)   SpO2 97%   BMI 24.39 kg/m  , BMI Body mass index is 24.39 kg/m.  GEN- The patient is elderly appearing, alert and oriented x 3 today.   HEENT: normocephalic, atraumatic; sclera clear, conjunctiva pink; hearing intact; oropharynx clear; neck supple  Lungs- Clear to ausculation bilaterally, normal work of breathing.  No wheezes, rales, rhonchi Heart- Regular rate and rhythm  GI- soft, non-tender, non-distended, bowel sounds present  Extremities- no clubbing, cyanosis, or edema  MS- no significant deformity or atrophy Skin- warm and dry, no rash or lesion; PPM pocket well healed Psych- euthymic mood, full affect Neuro- strength and sensation are intact  PPM Interrogation- reviewed in detail today,  See PACEART report  EKG:  EKG is not ordered today.  Recent Labs: No results found for requested labs within last 8760 hours.   Wt Readings from Last 3 Encounters:  11/25/18 170 lb (77.1 kg)  01/27/17 172 lb 6.4 oz (78.2 kg)  06/26/16 170 lb (77.1 kg)      Assessment and Plan:  1.  Symptomatic bradycardia Normal PPM function See Pace Art report No changes today Known noise on V lead. He does not V pace. Will follow  2.  CAD s/p CABG No recent ischemic symptoms Continue medical therapy  3.  HTN Stable No change required today    Current medicines are reviewed at length with the patient today.   The patient does not have concerns regarding his medicines.  The following changes were made today:  none  Labs/ tests ordered today include: none No orders of the defined types were placed in this encounter.    Disposition:   Follow up with Carelink, me in 1 year    Signed, Gypsy BalsamAmber Nikolaus Pienta, NP 11/25/2018 11:55 AM  [facility] [address] [place] [address] [phone] (office) [phone] (fax)

## 2018-11-25 ENCOUNTER — Encounter: Payer: Self-pay | Admitting: Nurse Practitioner

## 2018-11-25 ENCOUNTER — Ambulatory Visit (INDEPENDENT_AMBULATORY_CARE_PROVIDER_SITE_OTHER): Payer: Medicare Other | Admitting: Nurse Practitioner

## 2018-11-25 ENCOUNTER — Other Ambulatory Visit: Payer: Self-pay

## 2018-11-25 VITALS — BP 118/60 | HR 79 | Ht 70.0 in | Wt 170.0 lb

## 2018-11-25 DIAGNOSIS — I25810 Atherosclerosis of coronary artery bypass graft(s) without angina pectoris: Secondary | ICD-10-CM

## 2018-11-25 DIAGNOSIS — I10 Essential (primary) hypertension: Secondary | ICD-10-CM

## 2018-11-25 DIAGNOSIS — I495 Sick sinus syndrome: Secondary | ICD-10-CM | POA: Diagnosis not present

## 2018-11-25 NOTE — Patient Instructions (Addendum)
Medication Instructions:  none If you need a refill on your cardiac medications before your next appointment, please call your pharmacy.   Lab work: none If you have labs (blood work) drawn today and your tests are completely normal, you will receive your results only by: Marland Kitchen MyChart Message (if you have MyChart) OR . A paper copy in the mail If you have any lab test that is abnormal or we need to change your treatment, we will call you to review the results.  Testing/Procedures: none  Follow-Up: 1 year with [place], you and your health needs are our priority.  As part of our continuing mission to provide you with exceptional heart care, we have created designated Provider Care Teams.  These Care Teams include your primary Cardiologist (physician) and Advanced Practice Providers (APPs -  Physician Assistants and Nurse Practitioners) who all work together to provide you with the care you need, when you need it. .   Any Other Special Instructions Will Be Listed Below (If Applicable). Remote monitoring is used to monitor your Pacemaker of from home. This monitoring reduces the number of office visits required to check your device to one time per year. It allows [place] to keep an eye on the functioning of your device to ensure it is working properly. You are scheduled for a device check from home on 12/29/18. You may send your transmission at any time that day. If you have a wireless device, the transmission will be sent automatically. After your physician reviews your transmission, you will receive a postcard with your next transmission date.

## 2018-12-29 ENCOUNTER — Ambulatory Visit (INDEPENDENT_AMBULATORY_CARE_PROVIDER_SITE_OTHER): Payer: Medicare Other | Admitting: *Deleted

## 2018-12-29 DIAGNOSIS — I495 Sick sinus syndrome: Secondary | ICD-10-CM

## 2018-12-29 LAB — CUP PACEART REMOTE DEVICE CHECK
Battery Impedance: 186 Ohm
Battery Remaining Longevity: 123 mo
Battery Voltage: 2.79 V
Brady Statistic AP VP Percent: 1 %
Brady Statistic AP VS Percent: 87 %
Brady Statistic AS VP Percent: 0 %
Brady Statistic AS VS Percent: 12 %
Date Time Interrogation Session: 20200924123653
Implantable Lead Implant Date: 20080319
Implantable Lead Implant Date: 20080319
Implantable Lead Location: 753859
Implantable Lead Location: 753860
Implantable Lead Model: 5076
Implantable Lead Model: 5076
Implantable Pulse Generator Implant Date: 20170711
Lead Channel Impedance Value: 437 Ohm
Lead Channel Impedance Value: 571 Ohm
Lead Channel Pacing Threshold Amplitude: 0.875 V
Lead Channel Pacing Threshold Amplitude: 0.875 V
Lead Channel Pacing Threshold Pulse Width: 0.4 ms
Lead Channel Pacing Threshold Pulse Width: 0.4 ms
Lead Channel Setting Pacing Amplitude: 2 V
Lead Channel Setting Pacing Amplitude: 2.5 V
Lead Channel Setting Pacing Pulse Width: 0.4 ms
Lead Channel Setting Sensing Sensitivity: 2 mV

## 2019-01-09 ENCOUNTER — Encounter: Payer: Self-pay | Admitting: Cardiology

## 2019-01-09 NOTE — Progress Notes (Signed)
Remote pacemaker transmission.   

## 2019-03-30 ENCOUNTER — Ambulatory Visit (INDEPENDENT_AMBULATORY_CARE_PROVIDER_SITE_OTHER): Payer: Medicare Other | Admitting: *Deleted

## 2019-03-30 DIAGNOSIS — I495 Sick sinus syndrome: Secondary | ICD-10-CM | POA: Diagnosis not present

## 2019-04-01 LAB — CUP PACEART REMOTE DEVICE CHECK
Battery Impedance: 185 Ohm
Battery Remaining Longevity: 124 mo
Battery Voltage: 2.79 V
Brady Statistic AP VP Percent: 1 %
Brady Statistic AP VS Percent: 85 %
Brady Statistic AS VP Percent: 0 %
Brady Statistic AS VS Percent: 14 %
Date Time Interrogation Session: 20201224235535
Implantable Lead Implant Date: 20080319
Implantable Lead Implant Date: 20080319
Implantable Lead Location: 753859
Implantable Lead Location: 753860
Implantable Lead Model: 5076
Implantable Lead Model: 5076
Implantable Pulse Generator Implant Date: 20170711
Lead Channel Impedance Value: 437 Ohm
Lead Channel Impedance Value: 548 Ohm
Lead Channel Pacing Threshold Amplitude: 0.625 V
Lead Channel Pacing Threshold Amplitude: 0.75 V
Lead Channel Pacing Threshold Pulse Width: 0.4 ms
Lead Channel Pacing Threshold Pulse Width: 0.4 ms
Lead Channel Setting Pacing Amplitude: 2 V
Lead Channel Setting Pacing Amplitude: 2.5 V
Lead Channel Setting Pacing Pulse Width: 0.4 ms
Lead Channel Setting Sensing Sensitivity: 2 mV

## 2019-05-03 ENCOUNTER — Telehealth: Payer: Self-pay

## 2019-05-03 NOTE — Telephone Encounter (Signed)
Open by accident

## 2019-05-06 ENCOUNTER — Ambulatory Visit: Payer: Medicare Other

## 2019-06-29 ENCOUNTER — Ambulatory Visit (INDEPENDENT_AMBULATORY_CARE_PROVIDER_SITE_OTHER): Payer: Medicare PPO | Admitting: *Deleted

## 2019-06-29 DIAGNOSIS — I495 Sick sinus syndrome: Secondary | ICD-10-CM | POA: Diagnosis not present

## 2019-06-29 LAB — CUP PACEART REMOTE DEVICE CHECK
Battery Impedance: 210 Ohm
Battery Remaining Longevity: 120 mo
Battery Voltage: 2.79 V
Brady Statistic AP VP Percent: 1 %
Brady Statistic AP VS Percent: 85 %
Brady Statistic AS VP Percent: 0 %
Brady Statistic AS VS Percent: 14 %
Date Time Interrogation Session: 20210325102600
Implantable Lead Implant Date: 20080319
Implantable Lead Implant Date: 20080319
Implantable Lead Location: 753859
Implantable Lead Location: 753860
Implantable Lead Model: 5076
Implantable Lead Model: 5076
Implantable Pulse Generator Implant Date: 20170711
Lead Channel Impedance Value: 442 Ohm
Lead Channel Impedance Value: 569 Ohm
Lead Channel Pacing Threshold Amplitude: 0.875 V
Lead Channel Pacing Threshold Amplitude: 0.875 V
Lead Channel Pacing Threshold Pulse Width: 0.4 ms
Lead Channel Pacing Threshold Pulse Width: 0.4 ms
Lead Channel Setting Pacing Amplitude: 2 V
Lead Channel Setting Pacing Amplitude: 2.5 V
Lead Channel Setting Pacing Pulse Width: 0.4 ms
Lead Channel Setting Sensing Sensitivity: 2 mV

## 2019-06-30 NOTE — Progress Notes (Signed)
PPM Remote  

## 2019-09-28 ENCOUNTER — Ambulatory Visit (INDEPENDENT_AMBULATORY_CARE_PROVIDER_SITE_OTHER): Payer: Medicare PPO | Admitting: *Deleted

## 2019-09-28 DIAGNOSIS — I495 Sick sinus syndrome: Secondary | ICD-10-CM

## 2019-09-28 LAB — CUP PACEART REMOTE DEVICE CHECK
Battery Impedance: 210 Ohm
Battery Remaining Longevity: 120 mo
Battery Voltage: 2.79 V
Brady Statistic AP VP Percent: 1 %
Brady Statistic AP VS Percent: 85 %
Brady Statistic AS VP Percent: 0 %
Brady Statistic AS VS Percent: 14 %
Date Time Interrogation Session: 20210624152135
Implantable Lead Implant Date: 20080319
Implantable Lead Implant Date: 20080319
Implantable Lead Location: 753859
Implantable Lead Location: 753860
Implantable Lead Model: 5076
Implantable Lead Model: 5076
Implantable Pulse Generator Implant Date: 20170711
Lead Channel Impedance Value: 455 Ohm
Lead Channel Impedance Value: 596 Ohm
Lead Channel Pacing Threshold Amplitude: 0.75 V
Lead Channel Pacing Threshold Amplitude: 0.875 V
Lead Channel Pacing Threshold Pulse Width: 0.4 ms
Lead Channel Pacing Threshold Pulse Width: 0.4 ms
Lead Channel Setting Pacing Amplitude: 2 V
Lead Channel Setting Pacing Amplitude: 2.5 V
Lead Channel Setting Pacing Pulse Width: 0.4 ms
Lead Channel Setting Sensing Sensitivity: 2 mV

## 2019-10-02 NOTE — Progress Notes (Signed)
Remote pacemaker transmission.   

## 2019-11-23 DIAGNOSIS — H40053 Ocular hypertension, bilateral: Secondary | ICD-10-CM | POA: Diagnosis not present

## 2019-11-23 DIAGNOSIS — H402212 Chronic angle-closure glaucoma, right eye, moderate stage: Secondary | ICD-10-CM | POA: Diagnosis not present

## 2019-11-23 DIAGNOSIS — E119 Type 2 diabetes mellitus without complications: Secondary | ICD-10-CM | POA: Diagnosis not present

## 2019-11-23 DIAGNOSIS — H402221 Chronic angle-closure glaucoma, left eye, mild stage: Secondary | ICD-10-CM | POA: Diagnosis not present

## 2019-11-23 LAB — HM DIABETES EYE EXAM

## 2019-11-29 NOTE — Progress Notes (Signed)
Electrophysiology Office Note Date: 11/30/2019  ID:  [name], DOB 19-Oct-1928, MRN [id number]  PCP: Jarome Matin, MD Primary Cardiologist: [place] Electrophysiologist: [place]  CC: Pacemaker follow-up  Peter Reynolds is a 86 y.o. male seen today for Dr Johney Frame.  He presents today for routine electrophysiology followup.  Since last being seen in our clinic, the patient reports doing very well. He continues to walk a mile each day without symptoms.   He denies chest pain, palpitations, dyspnea, PND, orthopnea, nausea, vomiting, dizziness, syncope, edema, weight gain, or early satiety.  Device History: MDT dual chamber PPM implanted 2008 for SSS; gen change 2017   Past Medical History:  Diagnosis Date   CAD    s/p CABG   Diabetes mellitus    Dyslipidemia    Essential hypertension, benign    Hematuria    RBBB    Sick sinus syndrome (HCC)    s/p PPM (MDT) by Dr Reyes Ivan 2008   Past Surgical History:  Procedure Laterality Date   Coronary artery bypass grafting.     lima-lad,svg-OM1-2,svg-rca   Dual-chamber permanent pacemaker implant 2008  06/23/2006   MDT Adapta implanted by Dr Reyes Ivan   EP IMPLANTABLE DEVICE N/A 10/15/2015   Procedure:  PPM Generator Changeout;  Surgeon: Hillis Range, MD;  Location: [facility];  Service: Cardiovascular;  Laterality: N/A;   HAND SURGERY  2004   HERNIA REPAIR  1995   right    Current Outpatient Medications  Medication Sig Dispense Refill   aspirin EC 81 MG tablet Take 81 mg by mouth every morning.     diltiazem (TIAZAC) 360 MG 24 hr capsule Take 360 mg by mouth daily.      lisinopril-hydrochlorothiazide (PRINZIDE,ZESTORETIC) 20-12.5 MG per tablet Take 1 tablet by mouth daily.      metFORMIN (GLUCOPHAGE-XR) 500 MG 24 hr tablet Take 1,000 mg by mouth daily with supper.      Multiple Vitamin (MULTI-VITAMIN PO) Take 1 tablet by mouth daily.      pravastatin (PRAVACHOL) 40 MG tablet Take 40  mg by mouth at bedtime.      VITAMIN E PO Take 400 Units by mouth daily.      No current facility-administered medications for this visit.    Allergies:   Patient has no known allergies.   Social History: Social History   Socioeconomic History   Marital status: Widowed    Spouse name: Not on file   Number of children: 2   Years of education: Not on file   Highest education level: Not on file  Occupational History   Occupation: Runner, broadcasting/film/video    Comment: retired  Tobacco Use   Smoking status: Never Smoker   Smokeless tobacco: Never Used  Substance and Sexual Activity   Alcohol use: No   Drug use: No   Sexual activity: Not on file  Other Topics Concern   Not on file  Social History Narrative    Lives in [place] and attends [employer]   Social Determinants of Health   Financial Resource Strain:    Difficulty of Paying Living Expenses: Not on file  Food Insecurity:    Worried About Programme researcher, broadcasting/film/video in the Last Year: Not on file   [employer] of Food in the Last Year: Not on file  Transportation Needs:    Lack of Transportation (Medical): Not on file   Lack of Transportation (Non-Medical): Not on file  Physical Activity:    Days  of Exercise per Week: Not on file   Minutes of Exercise per Session: Not on file  Stress:    Feeling of Stress : Not on file  Social Connections:    Frequency of Communication with Friends and Family: Not on file   Frequency of Social Gatherings with Friends and Family: Not on file   Attends Religious Services: Not on file   Active Member of Clubs or Organizations: Not on file   Attends Banker Meetings: Not on file   Marital Status: Not on file  Intimate Partner Violence:    Fear of Current or Ex-Partner: Not on file   Emotionally Abused: Not on file   Physically Abused: Not on file   Sexually Abused: Not on file    Family History: Family History  Problem Relation Age of Onset    Hypertension Mother    Heart disease Father    Heart disease Brother      Review of Systems: All other systems reviewed and are otherwise negative except as noted above.   Physical Exam: VS:  BP 138/70    Pulse 78    Ht 5\' 10"  (1.778 m)    Wt 169 lb 12.8 oz (77 kg)    SpO2 95%    BMI 24.36 kg/m  , BMI Body mass index is 24.36 kg/m.  GEN- The patient is elderly appearing, alert and oriented x 3 today.   HEENT: normocephalic, atraumatic; sclera clear, conjunctiva pink; hearing intact; oropharynx clear; neck supple  Lungs- Clear to ausculation bilaterally, normal work of breathing.  No wheezes, rales, rhonchi Heart- Regular rate and rhythm  GI- soft, non-tender, non-distended, bowel sounds present  Extremities- no clubbing, cyanosis, or edema  MS- no significant deformity or atrophy Skin- warm and dry, no rash or lesion; PPM pocket well healed Psych- euthymic mood, full affect Neuro- strength and sensation are intact  PPM Interrogation- reviewed in detail today,  See PACEART report  EKG:  EKG is not ordered today.   Recent Labs: No results found for requested labs within last 8760 hours.   Wt Readings from Last 3 Encounters:  11/30/19 169 lb 12.8 oz (77 kg)  11/25/18 170 lb (77.1 kg)  01/27/17 172 lb 6.4 oz (78.2 kg)      Assessment and Plan:  1.  Symptomatic bradycardia Normal PPM function See Pace Art report No changes today Known noise on V lead. He does not V pace, will follow  2.  CAD s/p CABG No recent ischemic symptoms Continue medical therapy, ASA, statin  Lipids followed by PCP  3.  HTN Stable No change required today    Current medicines are reviewed at length with the patient today.   The patient does not have concerns regarding his medicines.  The following changes were made today:  none  Labs/ tests ordered today include: none Orders Placed This Encounter  Procedures   EKG 12-Lead     Disposition:   Follow up with remotes, me in 1  year     Signed, 01/29/17, NP 11/30/2019 10:12 AM  [facility] [address] [place] [phone] (office) [phone] (fax)

## 2019-11-30 ENCOUNTER — Encounter: Payer: Self-pay | Admitting: Nurse Practitioner

## 2019-11-30 ENCOUNTER — Other Ambulatory Visit: Payer: Self-pay

## 2019-11-30 ENCOUNTER — Ambulatory Visit: Payer: Medicare PPO | Admitting: Nurse Practitioner

## 2019-11-30 VITALS — BP 138/70 | HR 78 | Ht 70.0 in | Wt 169.8 lb

## 2019-11-30 DIAGNOSIS — I25810 Atherosclerosis of coronary artery bypass graft(s) without angina pectoris: Secondary | ICD-10-CM

## 2019-11-30 DIAGNOSIS — I495 Sick sinus syndrome: Secondary | ICD-10-CM | POA: Diagnosis not present

## 2019-11-30 DIAGNOSIS — I10 Essential (primary) hypertension: Secondary | ICD-10-CM

## 2019-11-30 LAB — CUP PACEART INCLINIC DEVICE CHECK
Battery Impedance: 234 Ohm
Battery Remaining Longevity: 117 mo
Battery Voltage: 2.79 V
Brady Statistic AP VP Percent: 1 %
Brady Statistic AP VS Percent: 86 %
Brady Statistic AS VP Percent: 0 %
Brady Statistic AS VS Percent: 13 %
Date Time Interrogation Session: 20210826101100
Implantable Lead Implant Date: 20080319
Implantable Lead Implant Date: 20080319
Implantable Lead Location: 753859
Implantable Lead Location: 753860
Implantable Lead Model: 5076
Implantable Lead Model: 5076
Implantable Pulse Generator Implant Date: 20170711
Lead Channel Impedance Value: 481 Ohm
Lead Channel Impedance Value: 667 Ohm
Lead Channel Pacing Threshold Amplitude: 0.75 V
Lead Channel Pacing Threshold Amplitude: 0.75 V
Lead Channel Pacing Threshold Amplitude: 0.75 V
Lead Channel Pacing Threshold Amplitude: 0.75 V
Lead Channel Pacing Threshold Pulse Width: 0.4 ms
Lead Channel Pacing Threshold Pulse Width: 0.4 ms
Lead Channel Pacing Threshold Pulse Width: 0.4 ms
Lead Channel Pacing Threshold Pulse Width: 0.4 ms
Lead Channel Sensing Intrinsic Amplitude: 2.8 mV
Lead Channel Sensing Intrinsic Amplitude: 5.6 mV
Lead Channel Setting Pacing Amplitude: 2 V
Lead Channel Setting Pacing Amplitude: 2.5 V
Lead Channel Setting Pacing Pulse Width: 0.4 ms
Lead Channel Setting Sensing Sensitivity: 2 mV

## 2019-11-30 NOTE — Patient Instructions (Signed)
Medication Instructions:  *If you need a refill on your cardiac medications before your next appointment, please call your pharmacy*  Follow-Up: At [facility], you and your health needs are our priority.  As part of our continuing mission to provide you with exceptional heart care, we have created designated Provider Care Teams.  These Care Teams include your primary Cardiologist (physician) and Advanced Practice Providers (APPs -  Physician Assistants and Nurse Practitioners) who all work together to provide you with the care you need, when you need it.  We recommend signing up for the patient portal called "MyChart".  Sign up information is provided on this After Visit Summary.  MyChart is used to connect with patients for Virtual Visits (Telemedicine).  Patients are able to view lab/test results, encounter notes, upcoming appointments, etc.  Non-urgent messages can be sent to your provider as well.   To learn more about what you can do with MyChart, go to [email or web address].    Your next appointment:   Your physician wants you to follow-up in: 1 YEAR with Gypsy Balsam, NP. You will receive a reminder letter in the mail two months in advance. If you don't receive a letter, please call our office to schedule the follow-up appointment.  Remote monitoring is used to monitor your Pacemaker from home. This monitoring reduces the number of office visits required to check your device to one time per year. It allows [place] to keep an eye on the functioning of your device to ensure it is working properly. You are scheduled for a device check from home on 12/28/19. You may send your transmission at any time that day. If you have a wireless device, the transmission will be sent automatically. After your physician reviews your transmission, you will receive a postcard with your next transmission date.  The format for your next appointment:   In Person with Gypsy Balsam, NP

## 2019-12-28 ENCOUNTER — Ambulatory Visit (INDEPENDENT_AMBULATORY_CARE_PROVIDER_SITE_OTHER): Payer: Medicare PPO | Admitting: Emergency Medicine

## 2019-12-28 DIAGNOSIS — I495 Sick sinus syndrome: Secondary | ICD-10-CM

## 2019-12-29 LAB — CUP PACEART REMOTE DEVICE CHECK
Battery Impedance: 235 Ohm
Battery Remaining Longevity: 117 mo
Battery Voltage: 2.78 V
Brady Statistic AP VP Percent: 1 %
Brady Statistic AP VS Percent: 86 %
Brady Statistic AS VP Percent: 0 %
Brady Statistic AS VS Percent: 13 %
Date Time Interrogation Session: 20210923102346
Implantable Lead Implant Date: 20080319
Implantable Lead Implant Date: 20080319
Implantable Lead Location: 753859
Implantable Lead Location: 753860
Implantable Lead Model: 5076
Implantable Lead Model: 5076
Implantable Pulse Generator Implant Date: 20170711
Lead Channel Impedance Value: 475 Ohm
Lead Channel Impedance Value: 639 Ohm
Lead Channel Pacing Threshold Amplitude: 0.75 V
Lead Channel Pacing Threshold Amplitude: 0.75 V
Lead Channel Pacing Threshold Pulse Width: 0.4 ms
Lead Channel Pacing Threshold Pulse Width: 0.4 ms
Lead Channel Setting Pacing Amplitude: 2 V
Lead Channel Setting Pacing Amplitude: 2.5 V
Lead Channel Setting Pacing Pulse Width: 0.4 ms
Lead Channel Setting Sensing Sensitivity: 2 mV

## 2020-01-02 NOTE — Progress Notes (Signed)
Remote pacemaker transmission.   

## 2020-01-05 DIAGNOSIS — R051 Acute cough: Secondary | ICD-10-CM | POA: Diagnosis not present

## 2020-01-05 DIAGNOSIS — B342 Coronavirus infection, unspecified: Secondary | ICD-10-CM | POA: Diagnosis not present

## 2020-01-08 ENCOUNTER — Other Ambulatory Visit (HOSPITAL_COMMUNITY): Payer: Self-pay | Admitting: Family

## 2020-01-08 DIAGNOSIS — U071 COVID-19: Secondary | ICD-10-CM

## 2020-01-08 NOTE — Progress Notes (Signed)
I connected by phone with Peter Reynolds on 01/08/2020 at 2:50 PM to discuss the potential use of a new treatment for mild to moderate COVID-19 viral infection in non-hospitalized patients.  This patient is a 86 y.o. male that meets the FDA criteria for Emergency Use Authorization of COVID monoclonal antibody casirivimab/imdevimab or bamlanivimab/eteseviamb.  Has a (+) direct SARS-CoV-2 viral test result  Has mild or moderate COVID-19   Is NOT hospitalized due to COVID-19  Is within 10 days of symptom onset  Has at least one of the high risk factor(s) for progression to severe COVID-19 and/or hospitalization as defined in EUA.  Specific high risk criteria : Older age (>/= 84 yo), Diabetes and Cardiovascular disease or hypertension   Symptoms of cough, runny nose, fatigue, and poor appetite began 01/03/20.   I have spoken and communicated the following to the patient or parent/caregiver regarding COVID monoclonal antibody treatment:  1. FDA has authorized the emergency use for the treatment of mild to moderate COVID-19 in adults and pediatric patients with positive results of direct SARS-CoV-2 viral testing who are 1 years of age and older weighing at least 40 kg, and who are at high risk for progressing to severe COVID-19 and/or hospitalization.  2. The significant known and potential risks and benefits of COVID monoclonal antibody, and the extent to which such potential risks and benefits are unknown.  3. Information on available alternative treatments and the risks and benefits of those alternatives, including clinical trials.  4. Patients treated with COVID monoclonal antibody should continue to self-isolate and use infection control measures (e.g., wear mask, isolate, social distance, avoid sharing personal items, clean and disinfect "high touch" surfaces, and frequent handwashing) according to CDC guidelines.   5. The patient or parent/caregiver has the option to accept or  refuse COVID monoclonal antibody treatment.  After reviewing this information with the patient, the patient has agreed to receive one of the available covid 19 monoclonal antibodies and will be provided an appropriate fact sheet prior to infusion. [name], NP 01/08/2020 2:50 PM

## 2020-01-09 ENCOUNTER — Ambulatory Visit (HOSPITAL_COMMUNITY)
Admission: RE | Admit: 2020-01-09 | Discharge: 2020-01-09 | Disposition: A | Discharge status: Home / Self Care | Payer: Medicare Other | Source: Ambulatory Visit | Attending: Pulmonary Disease | Admitting: Pulmonary Disease

## 2020-01-09 ENCOUNTER — Other Ambulatory Visit (HOSPITAL_COMMUNITY): Payer: Self-pay

## 2020-01-09 DIAGNOSIS — U071 COVID-19: Secondary | ICD-10-CM | POA: Diagnosis present

## 2020-01-09 DIAGNOSIS — Z23 Encounter for immunization: Secondary | ICD-10-CM | POA: Diagnosis not present

## 2020-01-09 MED ORDER — SODIUM CHLORIDE 0.9 % IV SOLN
1200.0000 mg | Freq: Once | INTRAVENOUS | Status: AC
  Administered 2020-01-09: 1200 mg via INTRAVENOUS

## 2020-01-09 MED ORDER — DIPHENHYDRAMINE HCL 50 MG/ML IJ SOLN: 50.0000 mg | Freq: Once | INTRAMUSCULAR | Status: DC | PRN

## 2020-01-09 MED ORDER — SODIUM CHLORIDE 0.9 % IV SOLN: INTRAVENOUS | Status: DC | PRN

## 2020-01-09 MED ORDER — FAMOTIDINE IN NACL 20-0.9 MG/50ML-% IV SOLN: 20.0000 mg | Freq: Once | INTRAVENOUS | Status: DC | PRN

## 2020-01-09 MED ORDER — EPINEPHRINE 0.3 MG/0.3ML IJ SOAJ: 0.3000 mg | Freq: Once | INTRAMUSCULAR | Status: DC | PRN

## 2020-01-09 MED ORDER — METHYLPREDNISOLONE SODIUM SUCC 125 MG IJ SOLR: 125.0000 mg | Freq: Once | INTRAMUSCULAR | Status: DC | PRN

## 2020-01-09 MED ORDER — ALBUTEROL SULFATE HFA 108 (90 BASE) MCG/ACT IN AERS: 2.0000 | INHALATION_SPRAY | Freq: Once | RESPIRATORY_TRACT | Status: DC | PRN

## 2020-01-09 NOTE — Progress Notes (Signed)
  Diagnosis: COVID-19  Physician:Dr Wright  Procedure: Covid Infusion Clinic Med: casirivimab\imdevimab infusion - Provided patient with casirivimab\imdevimab fact sheet for patients, parents and caregivers prior to infusion.  Complications: No immediate complications noted.  Discharge: Discharged home   Peter Reynolds 01/09/2020  

## 2020-01-09 NOTE — Discharge Instructions (Signed)

## 2020-01-17 DIAGNOSIS — U071 COVID-19: Secondary | ICD-10-CM | POA: Diagnosis not present

## 2020-01-26 DIAGNOSIS — S8991XA Unspecified injury of right lower leg, initial encounter: Secondary | ICD-10-CM | POA: Diagnosis not present

## 2020-01-26 DIAGNOSIS — S8011XA Contusion of right lower leg, initial encounter: Secondary | ICD-10-CM | POA: Diagnosis not present

## 2020-02-02 DIAGNOSIS — I10 Essential (primary) hypertension: Secondary | ICD-10-CM | POA: Diagnosis not present

## 2020-02-02 DIAGNOSIS — K219 Gastro-esophageal reflux disease without esophagitis: Secondary | ICD-10-CM | POA: Diagnosis not present

## 2020-02-02 DIAGNOSIS — I2510 Atherosclerotic heart disease of native coronary artery without angina pectoris: Secondary | ICD-10-CM | POA: Diagnosis not present

## 2020-02-02 DIAGNOSIS — N529 Male erectile dysfunction, unspecified: Secondary | ICD-10-CM | POA: Diagnosis not present

## 2020-02-02 DIAGNOSIS — Z950 Presence of cardiac pacemaker: Secondary | ICD-10-CM | POA: Diagnosis not present

## 2020-02-02 DIAGNOSIS — S8011XA Contusion of right lower leg, initial encounter: Secondary | ICD-10-CM | POA: Diagnosis not present

## 2020-02-02 DIAGNOSIS — I739 Peripheral vascular disease, unspecified: Secondary | ICD-10-CM | POA: Diagnosis not present

## 2020-02-02 DIAGNOSIS — E785 Hyperlipidemia, unspecified: Secondary | ICD-10-CM | POA: Diagnosis not present

## 2020-02-02 DIAGNOSIS — E1151 Type 2 diabetes mellitus with diabetic peripheral angiopathy without gangrene: Secondary | ICD-10-CM | POA: Diagnosis not present

## 2020-03-28 ENCOUNTER — Telehealth: Payer: Self-pay | Admitting: Nurse Practitioner

## 2020-03-28 NOTE — Telephone Encounter (Signed)
Naji is calling stating he was unable to send in his transmission reading in today. He states he reached out to the device company and is requesting a callback to inform someone in the device clinic of what he was advised. Please advise.

## 2020-03-28 NOTE — Telephone Encounter (Signed)
Pt is receiving a new handheld in 7-10 business days. °

## 2020-04-08 ENCOUNTER — Ambulatory Visit (INDEPENDENT_AMBULATORY_CARE_PROVIDER_SITE_OTHER): Payer: Medicare PPO

## 2020-04-08 DIAGNOSIS — I495 Sick sinus syndrome: Secondary | ICD-10-CM | POA: Diagnosis not present

## 2020-04-08 LAB — CUP PACEART REMOTE DEVICE CHECK
Battery Impedance: 234 Ohm
Battery Remaining Longevity: 117 mo
Battery Voltage: 2.79 V
Brady Statistic AP VP Percent: 3 %
Brady Statistic AP VS Percent: 80 %
Brady Statistic AS VP Percent: 0 %
Brady Statistic AS VS Percent: 17 %
Date Time Interrogation Session: 20220103124549
Implantable Lead Implant Date: 20080319
Implantable Lead Implant Date: 20080319
Implantable Lead Location: 753859
Implantable Lead Location: 753860
Implantable Lead Model: 5076
Implantable Lead Model: 5076
Implantable Pulse Generator Implant Date: 20170711
Lead Channel Impedance Value: 455 Ohm
Lead Channel Impedance Value: 578 Ohm
Lead Channel Pacing Threshold Amplitude: 0.625 V
Lead Channel Pacing Threshold Amplitude: 0.75 V
Lead Channel Pacing Threshold Pulse Width: 0.4 ms
Lead Channel Pacing Threshold Pulse Width: 0.4 ms
Lead Channel Setting Pacing Amplitude: 2 V
Lead Channel Setting Pacing Amplitude: 2.5 V
Lead Channel Setting Pacing Pulse Width: 0.4 ms
Lead Channel Setting Sensing Sensitivity: 2 mV

## 2020-04-23 NOTE — Progress Notes (Signed)
Remote pacemaker transmission.   

## 2020-05-21 DIAGNOSIS — R0602 Shortness of breath: Secondary | ICD-10-CM | POA: Diagnosis not present

## 2020-05-21 DIAGNOSIS — R059 Cough, unspecified: Secondary | ICD-10-CM | POA: Diagnosis not present

## 2020-05-21 DIAGNOSIS — Z20818 Contact with and (suspected) exposure to other bacterial communicable diseases: Secondary | ICD-10-CM | POA: Diagnosis not present

## 2020-05-21 DIAGNOSIS — Z20828 Contact with and (suspected) exposure to other viral communicable diseases: Secondary | ICD-10-CM | POA: Diagnosis not present

## 2020-05-21 DIAGNOSIS — R0981 Nasal congestion: Secondary | ICD-10-CM | POA: Diagnosis not present

## 2020-05-21 DIAGNOSIS — U071 COVID-19: Secondary | ICD-10-CM | POA: Diagnosis not present

## 2020-06-13 DIAGNOSIS — L03818 Cellulitis of other sites: Secondary | ICD-10-CM | POA: Diagnosis not present

## 2020-06-13 DIAGNOSIS — I739 Peripheral vascular disease, unspecified: Secondary | ICD-10-CM | POA: Diagnosis not present

## 2020-06-13 DIAGNOSIS — E785 Hyperlipidemia, unspecified: Secondary | ICD-10-CM | POA: Diagnosis not present

## 2020-06-13 DIAGNOSIS — E119 Type 2 diabetes mellitus without complications: Secondary | ICD-10-CM | POA: Diagnosis not present

## 2020-06-13 DIAGNOSIS — I10 Essential (primary) hypertension: Secondary | ICD-10-CM | POA: Diagnosis not present

## 2020-06-14 DIAGNOSIS — H402221 Chronic angle-closure glaucoma, left eye, mild stage: Secondary | ICD-10-CM | POA: Diagnosis not present

## 2020-06-14 DIAGNOSIS — H402212 Chronic angle-closure glaucoma, right eye, moderate stage: Secondary | ICD-10-CM | POA: Diagnosis not present

## 2020-06-14 DIAGNOSIS — H40053 Ocular hypertension, bilateral: Secondary | ICD-10-CM | POA: Diagnosis not present

## 2020-07-08 ENCOUNTER — Telehealth: Payer: Self-pay

## 2020-07-08 ENCOUNTER — Ambulatory Visit (INDEPENDENT_AMBULATORY_CARE_PROVIDER_SITE_OTHER): Payer: Medicare PPO

## 2020-07-08 DIAGNOSIS — I495 Sick sinus syndrome: Secondary | ICD-10-CM

## 2020-07-08 NOTE — Telephone Encounter (Signed)
The patient called because he was having difficulty sending a transmission. I called tech support to get additional help. Transmission received.

## 2020-07-10 LAB — CUP PACEART REMOTE DEVICE CHECK
Battery Impedance: 283 Ohm
Battery Remaining Longevity: 111 mo
Battery Voltage: 2.79 V
Brady Statistic AP VP Percent: 2 %
Brady Statistic AP VS Percent: 80 %
Brady Statistic AS VP Percent: 0 %
Brady Statistic AS VS Percent: 18 %
Date Time Interrogation Session: 20220404095225
Implantable Lead Implant Date: 20080319
Implantable Lead Implant Date: 20080319
Implantable Lead Location: 753859
Implantable Lead Location: 753860
Implantable Lead Model: 5076
Implantable Lead Model: 5076
Implantable Pulse Generator Implant Date: 20170711
Lead Channel Impedance Value: 461 Ohm
Lead Channel Impedance Value: 682 Ohm
Lead Channel Pacing Threshold Amplitude: 0.75 V
Lead Channel Pacing Threshold Amplitude: 0.75 V
Lead Channel Pacing Threshold Pulse Width: 0.4 ms
Lead Channel Pacing Threshold Pulse Width: 0.4 ms
Lead Channel Setting Pacing Amplitude: 2 V
Lead Channel Setting Pacing Amplitude: 2.5 V
Lead Channel Setting Pacing Pulse Width: 0.4 ms
Lead Channel Setting Sensing Sensitivity: 2 mV

## 2020-07-18 NOTE — Progress Notes (Signed)
Remote pacemaker transmission.   

## 2020-09-05 DIAGNOSIS — L570 Actinic keratosis: Secondary | ICD-10-CM | POA: Diagnosis not present

## 2020-09-05 DIAGNOSIS — Z85828 Personal history of other malignant neoplasm of skin: Secondary | ICD-10-CM | POA: Diagnosis not present

## 2020-09-05 DIAGNOSIS — D2372 Other benign neoplasm of skin of left lower limb, including hip: Secondary | ICD-10-CM | POA: Diagnosis not present

## 2020-09-05 DIAGNOSIS — L821 Other seborrheic keratosis: Secondary | ICD-10-CM | POA: Diagnosis not present

## 2020-09-05 DIAGNOSIS — D1801 Hemangioma of skin and subcutaneous tissue: Secondary | ICD-10-CM | POA: Diagnosis not present

## 2020-09-26 DIAGNOSIS — E1151 Type 2 diabetes mellitus with diabetic peripheral angiopathy without gangrene: Secondary | ICD-10-CM | POA: Diagnosis not present

## 2020-09-26 DIAGNOSIS — Z0000 Encounter for general adult medical examination without abnormal findings: Secondary | ICD-10-CM | POA: Diagnosis not present

## 2020-09-26 DIAGNOSIS — E785 Hyperlipidemia, unspecified: Secondary | ICD-10-CM | POA: Diagnosis not present

## 2020-09-26 DIAGNOSIS — Z125 Encounter for screening for malignant neoplasm of prostate: Secondary | ICD-10-CM | POA: Diagnosis not present

## 2020-10-03 DIAGNOSIS — Z0000 Encounter for general adult medical examination without abnormal findings: Secondary | ICD-10-CM | POA: Diagnosis not present

## 2020-10-03 DIAGNOSIS — E1151 Type 2 diabetes mellitus with diabetic peripheral angiopathy without gangrene: Secondary | ICD-10-CM | POA: Diagnosis not present

## 2020-10-03 DIAGNOSIS — R82998 Other abnormal findings in urine: Secondary | ICD-10-CM | POA: Diagnosis not present

## 2020-10-03 DIAGNOSIS — N529 Male erectile dysfunction, unspecified: Secondary | ICD-10-CM | POA: Diagnosis not present

## 2020-10-03 DIAGNOSIS — H6123 Impacted cerumen, bilateral: Secondary | ICD-10-CM | POA: Diagnosis not present

## 2020-10-03 DIAGNOSIS — I10 Essential (primary) hypertension: Secondary | ICD-10-CM | POA: Diagnosis not present

## 2020-10-03 DIAGNOSIS — K219 Gastro-esophageal reflux disease without esophagitis: Secondary | ICD-10-CM | POA: Diagnosis not present

## 2020-10-03 DIAGNOSIS — I872 Venous insufficiency (chronic) (peripheral): Secondary | ICD-10-CM | POA: Diagnosis not present

## 2020-10-03 DIAGNOSIS — I739 Peripheral vascular disease, unspecified: Secondary | ICD-10-CM | POA: Diagnosis not present

## 2020-10-03 DIAGNOSIS — I2510 Atherosclerotic heart disease of native coronary artery without angina pectoris: Secondary | ICD-10-CM | POA: Diagnosis not present

## 2020-11-21 DIAGNOSIS — Z604 Social exclusion and rejection: Secondary | ICD-10-CM | POA: Diagnosis not present

## 2020-11-21 DIAGNOSIS — E119 Type 2 diabetes mellitus without complications: Secondary | ICD-10-CM | POA: Diagnosis not present

## 2020-11-21 DIAGNOSIS — R32 Unspecified urinary incontinence: Secondary | ICD-10-CM | POA: Diagnosis not present

## 2020-11-21 DIAGNOSIS — I10 Essential (primary) hypertension: Secondary | ICD-10-CM | POA: Diagnosis not present

## 2020-11-21 DIAGNOSIS — N529 Male erectile dysfunction, unspecified: Secondary | ICD-10-CM | POA: Diagnosis not present

## 2020-11-21 DIAGNOSIS — I2510 Atherosclerotic heart disease of native coronary artery without angina pectoris: Secondary | ICD-10-CM | POA: Diagnosis not present

## 2020-11-21 DIAGNOSIS — K219 Gastro-esophageal reflux disease without esophagitis: Secondary | ICD-10-CM | POA: Diagnosis not present

## 2020-11-21 DIAGNOSIS — I495 Sick sinus syndrome: Secondary | ICD-10-CM | POA: Diagnosis not present

## 2020-11-21 DIAGNOSIS — E785 Hyperlipidemia, unspecified: Secondary | ICD-10-CM | POA: Diagnosis not present

## 2020-11-29 ENCOUNTER — Encounter: Payer: Medicare PPO | Admitting: Physician Assistant

## 2020-12-01 NOTE — Progress Notes (Signed)
Electrophysiology Office Note Date: 12/02/2020  ID:  Peter Reynolds, DOB [birth year], MRN [id number]  PCP: Peter Matin, MD Primary Cardiologist: None Electrophysiologist: Peter Range, MD   CC: Pacemaker follow-up  Peter Reynolds is a 87 y.o. male seen today for Peter Range, MD for routine electrophysiology followup.  Since last being seen in our clinic the patient reports doing very well.  he denies chest pain, palpitations, dyspnea, PND, orthopnea, nausea, vomiting, dizziness, syncope, edema, weight gain, or early satiety.  Device History: MDT dual chamber PPM implanted 2008 for SSS; gen change 2017  Past Medical History:  Diagnosis Date   CAD    s/p CABG   Diabetes mellitus    Dyslipidemia    Essential hypertension, benign    Hematuria    RBBB    Sick sinus syndrome (HCC)    s/p PPM (MDT) by Dr Peter Reynolds 2008   Past Surgical History:  Procedure Laterality Date   Coronary artery bypass grafting.     lima-lad,svg-OM1-2,svg-rca   Dual-chamber permanent pacemaker implant 2008  06/23/2006   MDT Adapta implanted by Dr Peter Reynolds   EP IMPLANTABLE DEVICE N/A 10/15/2015   Procedure:  PPM Generator Changeout;  Surgeon: Peter Range, MD;  Location: [facility];  Service: Cardiovascular;  Laterality: N/A;   HAND SURGERY  2004   HERNIA REPAIR  1995   right    Current Outpatient Medications  Medication Sig Dispense Refill   aspirin EC 81 MG tablet Take 81 mg by mouth every morning.     diltiazem (TIAZAC) 360 MG 24 hr capsule Take 360 mg by mouth daily.      lisinopril-hydrochlorothiazide (PRINZIDE,ZESTORETIC) 20-12.5 MG per tablet Take 1 tablet by mouth daily.      metFORMIN (GLUCOPHAGE-XR) 500 MG 24 hr tablet Take 1,000 mg by mouth daily with supper.      Multiple Vitamin (MULTI-VITAMIN PO) Take 1 tablet by mouth daily.      pravastatin (PRAVACHOL) 40 MG tablet Take 40 mg by mouth at bedtime.      VITAMIN E PO Take 400 Units by mouth daily.       No current facility-administered medications for this visit.    Allergies:   Patient has no known allergies.   Social History: Social History   Socioeconomic History   Marital status: Widowed    Spouse name: Not on file   Number of children: 2   Years of education: Not on file   Highest education level: Not on file  Occupational History   Occupation: Runner, broadcasting/film/video    Comment: retired  Tobacco Use   Smoking status: Never   Smokeless tobacco: Never  Substance and Sexual Activity   Alcohol use: No   Drug use: No   Sexual activity: Not on file  Other Topics Concern   Not on file  Social History Narrative    Lives in [place] and attends [employer]   Social Determinants of Health   Financial Resource Strain: Not on file  Food Insecurity: Not on file  Transportation Needs: Not on file  Physical Activity: Not on file  Stress: Not on file  Social Connections: Not on file  Intimate Partner Violence: Not on file    Family History: Family History  Problem Relation Age of Onset   Hypertension Mother    Heart disease Father    Heart disease Brother      Review of Systems: All other systems reviewed and are otherwise negative except as  noted above.  Physical Exam: Vitals:   12/02/20 0921  BP: 126/78  Pulse: 84  SpO2: 96%  Weight: 171 lb 14.1 oz (78 kg)  Height: 5\' 10"  (1.778 m)     GEN- The patient is well appearing, alert and oriented x 3 today.   HEENT: normocephalic, atraumatic; sclera clear, conjunctiva pink; hearing intact; oropharynx clear; neck supple  Lungs- Clear to ausculation bilaterally, normal work of breathing.  No wheezes, rales, rhonchi Heart- Regular rate and rhythm, no murmurs, rubs or gallops  GI- soft, non-tender, non-distended, bowel sounds present  Extremities- no clubbing or cyanosis. No edema MS- no significant deformity or atrophy Skin- warm and dry, no rash or lesion; PPM pocket well healed Psych- euthymic mood,  full affect Neuro- strength and sensation are intact  PPM Interrogation- reviewed in detail today,  See PACEART report  EKG:  EKG is ordered today. Personal review of ekg ordered today shows typical appearing atrial flutter (cycle length ~250 ms by device interrogation)   Recent Labs: No results found for requested labs within last 8760 hours.   Wt Readings from Last 3 Encounters:  12/02/20 171 lb 14.1 oz (78 kg)  11/30/19 169 lb 12.8 oz (77 kg)  11/25/18 170 lb (77.1 kg)     Other studies Reviewed: Additional studies/ records that were reviewed today include: Previous EP office notes, Previous remote checks, Most recent labwork.   Assessment and Plan:  1. Symptomatic bradycardia s/p Medtronic PPM  Normal PPM function See Pace Art report No changes today Known noise on V lead. He does not V pace, will follow   2.  CAD s/p CABG Denies s/s ischemia   Continue medical therapy, ASA, statin  Lipids followed by PCP   3.  HTN Stable on current regimen  4. AFL EKG with AFL at 250 ms atrial cycle length (clarified by device interrogation) He has had paroxysms of AT vs AF in the past, but always short. He has been in this rhythm since 8/21. He has thus far been asymptomatic. CHA2DS2/VASc is at least 5. We discussed risk of stroke at length and he is willing to start OAC. Will plan on starting Eliquis after labwork to confirm dose. He is borderline by age/weight, Cr will decide dose.  Will check labs today and in 10 days. RTC 4 weeks.  If remains out of rhythm can attempt to pace out.  If symptomatic and unable to pace out, can consider [facility] at that time.  VHR episodes consistent with known noise.    Current medicines are reviewed at length with the patient today.  Potential risk of bleeding vs potential risk of stroke discussed at length. With shared decision making we decided to attempt to start [facility]. He has no history of bleeding.  Labs/ tests ordered today include:  Orders  Placed This Encounter  Procedures   Basic metabolic panel   CBC   CBC   EKG 12-Lead   Disposition:   Follow up with EP APP in 4 Weeks to determine further plan once he is anticoagulated.   [facility], PA-C  12/02/2020 9:43 AM  [facility] [address] [place] [phone] (office) [phone] (fax)

## 2020-12-02 ENCOUNTER — Telehealth: Payer: Self-pay

## 2020-12-02 ENCOUNTER — Ambulatory Visit: Payer: Medicare PPO | Admitting: Student

## 2020-12-02 ENCOUNTER — Encounter: Payer: Self-pay | Admitting: Student

## 2020-12-02 ENCOUNTER — Other Ambulatory Visit: Payer: Self-pay

## 2020-12-02 VITALS — BP 126/78 | HR 84 | Ht 70.0 in | Wt 171.9 lb

## 2020-12-02 DIAGNOSIS — I25810 Atherosclerosis of coronary artery bypass graft(s) without angina pectoris: Secondary | ICD-10-CM | POA: Diagnosis not present

## 2020-12-02 DIAGNOSIS — I10 Essential (primary) hypertension: Secondary | ICD-10-CM

## 2020-12-02 DIAGNOSIS — I495 Sick sinus syndrome: Secondary | ICD-10-CM | POA: Diagnosis not present

## 2020-12-02 LAB — CUP PACEART INCLINIC DEVICE CHECK
Battery Impedance: 284 Ohm
Battery Remaining Longevity: 109 mo
Battery Voltage: 2.78 V
Brady Statistic AP VP Percent: 1 %
Brady Statistic AP VS Percent: 79 %
Brady Statistic AS VP Percent: 0 %
Brady Statistic AS VS Percent: 19 %
Date Time Interrogation Session: 20220829114420
Implantable Lead Implant Date: 20080319
Implantable Lead Implant Date: 20080319
Implantable Lead Location: 753859
Implantable Lead Location: 753860
Implantable Lead Model: 5076
Implantable Lead Model: 5076
Implantable Pulse Generator Implant Date: 20170711
Lead Channel Impedance Value: 424 Ohm
Lead Channel Impedance Value: 529 Ohm
Lead Channel Pacing Threshold Amplitude: 0.75 V
Lead Channel Pacing Threshold Amplitude: 1 V
Lead Channel Pacing Threshold Amplitude: 1 V
Lead Channel Pacing Threshold Pulse Width: 0.4 ms
Lead Channel Pacing Threshold Pulse Width: 0.4 ms
Lead Channel Pacing Threshold Pulse Width: 0.4 ms
Lead Channel Sensing Intrinsic Amplitude: 2.8 mV
Lead Channel Sensing Intrinsic Amplitude: 5.6 mV
Lead Channel Setting Pacing Amplitude: 2 V
Lead Channel Setting Pacing Amplitude: 2.5 V
Lead Channel Setting Pacing Pulse Width: 0.4 ms
Lead Channel Setting Sensing Sensitivity: 2 mV

## 2020-12-02 LAB — CBC
Hematocrit: 40.7 % (ref 37.5–51.0)
Hemoglobin: 13.7 g/dL (ref 13.0–17.7)
MCH: 30.6 pg (ref 26.6–33.0)
MCHC: 33.7 g/dL (ref 31.5–35.7)
MCV: 91 fL (ref 79–97)
Platelets: 193 10*3/uL (ref 150–450)
RBC: 4.48 x10E6/uL (ref 4.14–5.80)
RDW: 13.6 % (ref 11.6–15.4)
WBC: 6.1 10*3/uL (ref 3.4–10.8)

## 2020-12-02 LAB — BASIC METABOLIC PANEL
BUN/Creatinine Ratio: 18 (ref 10–24)
BUN: 17 mg/dL (ref 8–27)
CO2: 31 mmol/L — ABNORMAL HIGH (ref 20–29)
Calcium: 9.4 mg/dL (ref 8.6–10.2)
Chloride: 99 mmol/L (ref 96–106)
Creatinine, Ser: 0.96 mg/dL (ref 0.76–1.27)
Glucose: 222 mg/dL — ABNORMAL HIGH (ref 65–99)
Potassium: 4.2 mmol/L (ref 3.5–5.2)
Sodium: 138 mmol/L (ref 134–144)
eGFR: 76 mL/min/{1.73_m2} (ref >59)

## 2020-12-02 MED ORDER — APIXABAN 5 MG PO TABS: 5.0000 mg | ORAL_TABLET | Freq: Two times a day (BID) | ORAL | 11 refills | Status: DC

## 2020-12-02 NOTE — Telephone Encounter (Signed)
-----   Message from Graciella Freer, PA-C sent at 12/02/2020  2:17 PM EDT ----- Please start on Eliquis 5 mg BID, we were undecided on agent and dose pending his creatinine.  He was not given 30 day card, if he needs to swing by first thing in the morning and get one he can.   Casimiro Needle [address] [place], [place]

## 2020-12-02 NOTE — Patient Instructions (Addendum)
Medication Instructions:  Your physician recommends that you continue on your current medications as directed. Please refer to the Current Medication list given to you today.  *If you need a refill on your cardiac medications before your next appointment, please call your pharmacy*   Lab Work: TODAY: BMET, CBC CBC on 12/13/2020  If you have labs (blood work) drawn today and your tests are completely normal, you will receive your results only by: MyChart Message (if you have MyChart) OR A paper copy in the mail If you have any lab test that is abnormal or we need to change your treatment, we will call you to review the results.   Follow-Up: At [facility], you and your health needs are our priority.  As part of our continuing mission to provide you with exceptional heart care, we have created designated Provider Care Teams.  These Care Teams include your primary Cardiologist (physician) and Advanced Practice Providers (APPs -  Physician Assistants and Nurse Practitioners) who all work together to provide you with the care you need, when you need it.  We recommend signing up for the patient portal called "MyChart".  Sign up information is provided on this After Visit Summary.  MyChart is used to connect with patients for Virtual Visits (Telemedicine).  Patients are able to view lab/test results, encounter notes, upcoming appointments, etc.  Non-urgent messages can be sent to your provider as well.   To learn more about what you can do with MyChart, go to [email or web address].    Your next appointment:   01/02/2021 with Otilio Saber, PA

## 2020-12-02 NOTE — Telephone Encounter (Signed)
The patient has been notified of the result and verbalized understanding.  All questions (if any) were answered. Ethelda Chick, RN 12/02/2020 3:52 PM    Placed order for eliquis 5 mg BID. Placed samples at the front for patient to pick up today with 30 day free trial card.

## 2020-12-04 ENCOUNTER — Other Ambulatory Visit: Payer: Self-pay

## 2020-12-04 MED ORDER — APIXABAN 5 MG PO TABS
5.0000 mg | ORAL_TABLET | Freq: Two times a day (BID) | ORAL | 3 refills | Status: DC
Start: 2020-12-04 — End: 2020-12-04

## 2020-12-04 MED ORDER — APIXABAN 5 MG PO TABS
5.0000 mg | ORAL_TABLET | Freq: Two times a day (BID) | ORAL | 0 refills | Status: DC
Start: 2020-12-04 — End: 2021-12-19

## 2020-12-13 ENCOUNTER — Other Ambulatory Visit: Payer: Medicare PPO | Admitting: *Deleted

## 2020-12-13 ENCOUNTER — Other Ambulatory Visit: Payer: Self-pay

## 2020-12-13 DIAGNOSIS — I25810 Atherosclerosis of coronary artery bypass graft(s) without angina pectoris: Secondary | ICD-10-CM | POA: Diagnosis not present

## 2020-12-13 DIAGNOSIS — I10 Essential (primary) hypertension: Secondary | ICD-10-CM | POA: Diagnosis not present

## 2020-12-13 DIAGNOSIS — I495 Sick sinus syndrome: Secondary | ICD-10-CM | POA: Diagnosis not present

## 2020-12-13 LAB — CBC
Hematocrit: 40.8 % (ref 37.5–51.0)
Hemoglobin: 13.7 g/dL (ref 13.0–17.7)
MCH: 30.4 pg (ref 26.6–33.0)
MCHC: 33.6 g/dL (ref 31.5–35.7)
MCV: 91 fL (ref 79–97)
Platelets: 169 10*3/uL (ref 150–450)
RBC: 4.51 x10E6/uL (ref 4.14–5.80)
RDW: 13.6 % (ref 11.6–15.4)
WBC: 6.5 10*3/uL (ref 3.4–10.8)

## 2020-12-18 DIAGNOSIS — H40053 Ocular hypertension, bilateral: Secondary | ICD-10-CM | POA: Diagnosis not present

## 2020-12-18 DIAGNOSIS — H402212 Chronic angle-closure glaucoma, right eye, moderate stage: Secondary | ICD-10-CM | POA: Diagnosis not present

## 2020-12-18 DIAGNOSIS — H402221 Chronic angle-closure glaucoma, left eye, mild stage: Secondary | ICD-10-CM | POA: Diagnosis not present

## 2020-12-18 DIAGNOSIS — H26493 Other secondary cataract, bilateral: Secondary | ICD-10-CM | POA: Diagnosis not present

## 2021-01-01 NOTE — Progress Notes (Signed)
Electrophysiology Office Note Date: 01/02/2021  ID:  Peter Reynolds, DOB October 18, 1928, MRN [id number]  PCP: Peter Matin, MD Primary Cardiologist: None Electrophysiologist: Peter Range, MD   CC: Pacemaker follow-up  Peter Reynolds is a 87 y.o. male seen today for Peter Range, MD for routine electrophysiology followup.  Since last being seen in our clinic the patient reports doing OK. At last visit he was incidentally found to be in an atrial flutter. He was started on   he denies chest pain, palpitations, dyspnea, PND, orthopnea, nausea, vomiting, dizziness, syncope, edema, weight gain, or early satiety.  Device History: MDT dual chamber PPM implanted 2008 for SSS; gen change 2017  Past Medical History:  Diagnosis Date   CAD    s/p CABG   Diabetes mellitus    Dyslipidemia    Essential hypertension, benign    Hematuria    RBBB    Sick sinus syndrome (HCC)    s/p PPM (MDT) by Dr Peter Reynolds 2008   Past Surgical History:  Procedure Laterality Date   Coronary artery bypass grafting.     lima-lad,svg-OM1-2,svg-rca   Dual-chamber permanent pacemaker implant 2008  06/23/2006   MDT Adapta implanted by Dr Peter Reynolds   EP IMPLANTABLE DEVICE N/A 10/15/2015   Procedure:  PPM Generator Changeout;  Surgeon: Peter Range, MD;  Location: [facility];  Service: Cardiovascular;  Laterality: N/A;   HAND SURGERY  2004   HERNIA REPAIR  1995   right    Current Outpatient Medications  Medication Sig Dispense Refill   apixaban (ELIQUIS) 5 MG TABS tablet Take 1 tablet (5 mg total) by mouth 2 (two) times daily. 60 tablet 0   diltiazem (TIAZAC) 360 MG 24 hr capsule Take 360 mg by mouth daily.      lisinopril-hydrochlorothiazide (PRINZIDE,ZESTORETIC) 20-12.5 MG per tablet Take 1 tablet by mouth daily.      metFORMIN (GLUCOPHAGE-XR) 500 MG 24 hr tablet Take 1,000 mg by mouth daily with supper.      Multiple Vitamin (MULTI-VITAMIN PO) Take 1 tablet by mouth daily.       pravastatin (PRAVACHOL) 40 MG tablet Take 40 mg by mouth at bedtime.      VITAMIN E PO Take 400 Units by mouth daily.      No current facility-administered medications for this visit.    Allergies:   Patient has no known allergies.   Social History: Social History   Socioeconomic History   Marital status: Widowed    Spouse name: Not on file   Number of children: 2   Years of education: Not on file   Highest education level: Not on file  Occupational History   Occupation: Runner, broadcasting/film/video    Comment: retired  Tobacco Use   Smoking status: Never   Smokeless tobacco: Never  Substance and Sexual Activity   Alcohol use: No   Drug use: No   Sexual activity: Not on file  Other Topics Concern   Not on file  Social History Narrative    Lives in [place] and attends [employer]   Social Determinants of Health   Financial Resource Strain: Not on file  Food Insecurity: Not on file  Transportation Needs: Not on file  Physical Activity: Not on file  Stress: Not on file  Social Connections: Not on file  Intimate Partner Violence: Not on file    Family History: Family History  Problem Relation Age of Onset   Hypertension Mother    Heart disease Father  Heart disease Brother      Review of Systems: All other systems reviewed and are otherwise negative except as noted above.  Physical Exam: Vitals:   01/02/21 1015  BP: 116/68  Pulse: 87  SpO2: 98%  Weight: 172 lb 12.8 oz (78.4 kg)  Height: 5\' 10"  (1.778 m)     GEN- The patient is well appearing, alert and oriented x 3 today.   HEENT: normocephalic, atraumatic; sclera clear, conjunctiva pink; hearing intact; oropharynx clear; neck supple  Lungs- Clear to ausculation bilaterally, normal work of breathing.  No wheezes, rales, rhonchi Heart- Regular rate and rhythm, no murmurs, rubs or gallops  GI- soft, non-tender, non-distended, bowel sounds present  Extremities- no clubbing or cyanosis. No edema MS-  no significant deformity or atrophy Skin- warm and dry, no rash or lesion; PPM pocket well healed Psych- euthymic mood, full affect Neuro- strength and sensation are intact  PPM Interrogation- reviewed in detail today,  See PACEART report  EKG:  EKG is not ordered today.   Recent Labs: 12/02/2020: BUN 17; Creatinine, Ser 0.96; Potassium 4.2; Sodium 138 12/13/2020: Hemoglobin 13.7; Platelets 169   Wt Readings from Last 3 Encounters:  01/02/21 172 lb 12.8 oz (78.4 kg)  12/02/20 171 lb 14.1 oz (78 kg)  11/30/19 169 lb 12.8 oz (77 kg)     Other studies Reviewed: Additional studies/ records that were reviewed today include: Previous EP office notes, Previous remote checks, Most recent labwork.   Assessment and Plan:  1. Symptomatic bradycardia s/p Medtronic PPM  Normal PPM function See Pace Art report No changes today  2.  CAD s/p CABG Denies s/s ischemia   3.  HTN Stable on current regimen    4. AFL On arrival remains in atrial flutter with cycle length today ~220 ms.  He has had paroxysms of AT vs AF in the past, but always short. He has been in this rhythm since 8/21. He has thus far been asymptomatic. He has been started on Eliquis and not missed any doses.  Atrial burst pacing used at 188 ms x ~15 seconds with return to NSR.  CHA2DS2/VASc is at least 5. Given his advanced age will continue his diltiazem without other medication changes at this time. He was asymptomatic, so if becomes a recurrent issue, rate control alone would not be unreasonable vs considering AAD.   Current medicines are reviewed at length with the patient today.    Labs/ tests ordered today include:  Orders Placed This Encounter  Procedures   Basic metabolic panel   CBC   CUP PACEART INCLINIC DEVICE CHECK    Disposition:   Follow up with EP APP in 3 Months    Signed, 9/21  01/02/2021 2:49 PM  [facility] [address] [place]  [place] [phone] (office) [phone] (fax)

## 2021-01-02 ENCOUNTER — Ambulatory Visit: Payer: Medicare PPO | Admitting: Student

## 2021-01-02 ENCOUNTER — Other Ambulatory Visit: Payer: Self-pay

## 2021-01-02 ENCOUNTER — Encounter: Payer: Self-pay | Admitting: Student

## 2021-01-02 ENCOUNTER — Encounter (INDEPENDENT_AMBULATORY_CARE_PROVIDER_SITE_OTHER): Payer: Self-pay

## 2021-01-02 VITALS — BP 116/68 | HR 87 | Ht 70.0 in | Wt 172.8 lb

## 2021-01-02 DIAGNOSIS — I10 Essential (primary) hypertension: Secondary | ICD-10-CM

## 2021-01-02 DIAGNOSIS — I4892 Unspecified atrial flutter: Secondary | ICD-10-CM | POA: Diagnosis not present

## 2021-01-02 DIAGNOSIS — I495 Sick sinus syndrome: Secondary | ICD-10-CM | POA: Diagnosis not present

## 2021-01-02 DIAGNOSIS — I25810 Atherosclerosis of coronary artery bypass graft(s) without angina pectoris: Secondary | ICD-10-CM

## 2021-01-02 LAB — BASIC METABOLIC PANEL
BUN/Creatinine Ratio: 16 (ref 10–24)
BUN: 15 mg/dL (ref 8–27)
CO2: 28 mmol/L (ref 20–29)
Calcium: 9.4 mg/dL (ref 8.6–10.2)
Chloride: 100 mmol/L (ref 96–106)
Creatinine, Ser: 0.96 mg/dL (ref 0.76–1.27)
Glucose: 138 mg/dL — ABNORMAL HIGH (ref 70–99)
Potassium: 4.1 mmol/L (ref 3.5–5.2)
Sodium: 140 mmol/L (ref 134–144)
eGFR: 76 mL/min/{1.73_m2} (ref >59)

## 2021-01-02 LAB — CBC
Hematocrit: 41.2 % (ref 37.5–51.0)
Hemoglobin: 14 g/dL (ref 13.0–17.7)
MCH: 30.4 pg (ref 26.6–33.0)
MCHC: 34 g/dL (ref 31.5–35.7)
MCV: 90 fL (ref 79–97)
Platelets: 197 10*3/uL (ref 150–450)
RBC: 4.6 x10E6/uL (ref 4.14–5.80)
RDW: 12.3 % (ref 11.6–15.4)
WBC: 6.6 10*3/uL (ref 3.4–10.8)

## 2021-01-02 LAB — CUP PACEART INCLINIC DEVICE CHECK
Battery Impedance: 309 Ohm
Battery Remaining Longevity: 106 mo
Battery Voltage: 2.78 V
Date Time Interrogation Session: 20220929144246
Implantable Lead Implant Date: 20080319
Implantable Lead Implant Date: 20080319
Implantable Lead Location: 753859
Implantable Lead Location: 753860
Implantable Lead Model: 5076
Implantable Lead Model: 5076
Implantable Pulse Generator Implant Date: 20170711
Lead Channel Impedance Value: 431 Ohm
Lead Channel Impedance Value: 589 Ohm
Lead Channel Pacing Threshold Amplitude: 0.75 V
Lead Channel Pacing Threshold Amplitude: 1 V
Lead Channel Pacing Threshold Amplitude: 1 V
Lead Channel Pacing Threshold Amplitude: 1.125 V
Lead Channel Pacing Threshold Pulse Width: 0.4 ms
Lead Channel Pacing Threshold Pulse Width: 0.4 ms
Lead Channel Pacing Threshold Pulse Width: 0.4 ms
Lead Channel Pacing Threshold Pulse Width: 0.4 ms
Lead Channel Sensing Intrinsic Amplitude: 2.8 mV
Lead Channel Sensing Intrinsic Amplitude: 5.6 mV
Lead Channel Setting Pacing Amplitude: 2 V
Lead Channel Setting Pacing Amplitude: 2.5 V
Lead Channel Setting Pacing Pulse Width: 0.4 ms
Lead Channel Setting Sensing Sensitivity: 2 mV

## 2021-01-02 NOTE — Patient Instructions (Signed)
Medication Instructions:  Your physician recommends that you continue on your current medications as directed. Please refer to the Current Medication list given to you today.  *If you need a refill on your cardiac medications before your next appointment, please call your pharmacy*   Lab Work: TODAY: BMET, CBC  If you have labs (blood work) drawn today and your tests are completely normal, you will receive your results only by: MyChart Message (if you have MyChart) OR A paper copy in the mail If you have any lab test that is abnormal or we need to change your treatment, we will call you to review the results.   Follow-Up: At [facility], you and your health needs are our priority.  As part of our continuing mission to provide you with exceptional heart care, we have created designated Provider Care Teams.  These Care Teams include your primary Cardiologist (physician) and Advanced Practice Providers (APPs -  Physician Assistants and Nurse Practitioners) who all work together to provide you with the care you need, when you need it.  We recommend signing up for the patient portal called "MyChart".  Sign up information is provided on this After Visit Summary.  MyChart is used to connect with patients for Virtual Visits (Telemedicine).  Patients are able to view lab/test results, encounter notes, upcoming appointments, etc.  Non-urgent messages can be sent to your provider as well.   To learn more about what you can do with MyChart, go to [email or web address].    Your next appointment:   03/27/2021

## 2021-01-07 ENCOUNTER — Ambulatory Visit (INDEPENDENT_AMBULATORY_CARE_PROVIDER_SITE_OTHER): Payer: Medicare PPO

## 2021-01-07 DIAGNOSIS — I495 Sick sinus syndrome: Secondary | ICD-10-CM | POA: Diagnosis not present

## 2021-01-08 LAB — CUP PACEART REMOTE DEVICE CHECK
Battery Impedance: 308 Ohm
Battery Remaining Longevity: 104 mo
Battery Voltage: 2.78 V
Brady Statistic AP VP Percent: 1 %
Brady Statistic AP VS Percent: 94 %
Brady Statistic AS VP Percent: 0 %
Brady Statistic AS VS Percent: 5 %
Date Time Interrogation Session: 20221005112801
Implantable Lead Implant Date: 20080319
Implantable Lead Implant Date: 20080319
Implantable Lead Location: 753859
Implantable Lead Location: 753860
Implantable Lead Model: 5076
Implantable Lead Model: 5076
Implantable Pulse Generator Implant Date: 20170711
Lead Channel Impedance Value: 425 Ohm
Lead Channel Impedance Value: 537 Ohm
Lead Channel Pacing Threshold Amplitude: 0.75 V
Lead Channel Pacing Threshold Amplitude: 0.875 V
Lead Channel Pacing Threshold Pulse Width: 0.4 ms
Lead Channel Pacing Threshold Pulse Width: 0.4 ms
Lead Channel Setting Pacing Amplitude: 2 V
Lead Channel Setting Pacing Amplitude: 2.5 V
Lead Channel Setting Pacing Pulse Width: 0.4 ms
Lead Channel Setting Sensing Sensitivity: 2 mV

## 2021-01-15 NOTE — Progress Notes (Signed)
Remote pacemaker transmission.   

## 2021-02-04 DIAGNOSIS — I10 Essential (primary) hypertension: Secondary | ICD-10-CM | POA: Diagnosis not present

## 2021-02-04 DIAGNOSIS — E785 Hyperlipidemia, unspecified: Secondary | ICD-10-CM | POA: Diagnosis not present

## 2021-02-04 DIAGNOSIS — I2510 Atherosclerotic heart disease of native coronary artery without angina pectoris: Secondary | ICD-10-CM | POA: Diagnosis not present

## 2021-02-04 DIAGNOSIS — E1151 Type 2 diabetes mellitus with diabetic peripheral angiopathy without gangrene: Secondary | ICD-10-CM | POA: Diagnosis not present

## 2021-02-04 DIAGNOSIS — G8929 Other chronic pain: Secondary | ICD-10-CM | POA: Diagnosis not present

## 2021-02-04 DIAGNOSIS — Z950 Presence of cardiac pacemaker: Secondary | ICD-10-CM | POA: Diagnosis not present

## 2021-02-04 DIAGNOSIS — I739 Peripheral vascular disease, unspecified: Secondary | ICD-10-CM | POA: Diagnosis not present

## 2021-02-04 DIAGNOSIS — M545 Low back pain, unspecified: Secondary | ICD-10-CM | POA: Diagnosis not present

## 2021-02-05 DIAGNOSIS — R059 Cough, unspecified: Secondary | ICD-10-CM | POA: Diagnosis not present

## 2021-03-26 NOTE — Progress Notes (Signed)
Electrophysiology Office Note Date: 03/27/2021  ID:  Lorenza Rusek, DOB 09-25-1928, MRN [id number]  PCP: Jarome Matin, MD Primary Cardiologist: None Electrophysiologist: Hillis Range, MD   CC: Pacemaker follow-up  Peter Reynolds is a 87 y.o. male seen today for Hillis Range, MD for routine electrophysiology followup.  Since last being seen in our clinic the patient reports doing very well.  he denies chest pain, palpitations, dyspnea, PND, orthopnea, nausea, vomiting, dizziness, syncope, edema, weight gain, or early satiety.  Device History: MDT dual chamber PPM implanted 2008 for SSS; gen change 2017  Past Medical History:  Diagnosis Date   CAD    s/p CABG   Diabetes mellitus    Dyslipidemia    Essential hypertension, benign    Hematuria    RBBB    Sick sinus syndrome (HCC)    s/p PPM (MDT) by Dr Reyes Ivan 2008   Past Surgical History:  Procedure Laterality Date   Coronary artery bypass grafting.     lima-lad,svg-OM1-2,svg-rca   Dual-chamber permanent pacemaker implant 2008  06/23/2006   MDT Adapta implanted by Dr Reyes Ivan   EP IMPLANTABLE DEVICE N/A 10/15/2015   Procedure:  PPM Generator Changeout;  Surgeon: Hillis Range, MD;  Location: [facility];  Service: Cardiovascular;  Laterality: N/A;   HAND SURGERY  2004   HERNIA REPAIR  1995   right    Current Outpatient Medications  Medication Sig Dispense Refill   apixaban (ELIQUIS) 5 MG TABS tablet Take 1 tablet (5 mg total) by mouth 2 (two) times daily. 60 tablet 0   diltiazem (TIAZAC) 360 MG 24 hr capsule Take 360 mg by mouth daily.      lisinopril-hydrochlorothiazide (PRINZIDE,ZESTORETIC) 20-12.5 MG per tablet Take 1 tablet by mouth daily.      metFORMIN (GLUCOPHAGE-XR) 500 MG 24 hr tablet Take 1,000 mg by mouth daily with supper.      Multiple Vitamin (MULTI-VITAMIN PO) Take 1 tablet by mouth daily.      pravastatin (PRAVACHOL) 40 MG tablet Take 40 mg by mouth at bedtime.      VITAMIN  E PO Take 400 Units by mouth daily.      No current facility-administered medications for this visit.    Allergies:   Patient has no known allergies.   Social History: Social History   Socioeconomic History   Marital status: Widowed    Spouse name: Not on file   Number of children: 2   Years of education: Not on file   Highest education level: Not on file  Occupational History   Occupation: Runner, broadcasting/film/video    Comment: retired  Tobacco Use   Smoking status: Never   Smokeless tobacco: Never  Substance and Sexual Activity   Alcohol use: No   Drug use: No   Sexual activity: Not on file  Other Topics Concern   Not on file  Social History Narrative    Lives in [place] and attends [employer]   Social Determinants of Health   Financial Resource Strain: Not on file  Food Insecurity: Not on file  Transportation Needs: Not on file  Physical Activity: Not on file  Stress: Not on file  Social Connections: Not on file  Intimate Partner Violence: Not on file    Family History: Family History  Problem Relation Age of Onset   Hypertension Mother    Heart disease Father    Heart disease Brother      Review of Systems: All other systems  reviewed and are otherwise negative except as noted above.  Physical Exam: Vitals:   03/27/21 0936  BP: 128/60  Pulse: 84  SpO2: 98%  Weight: 174 lb 9.6 oz (79.2 kg)  Height: 5\' 10"  (1.778 m)     GEN- The patient is well appearing, alert and oriented x 3 today.   HEENT: normocephalic, atraumatic; sclera clear, conjunctiva pink; hearing intact; oropharynx clear; neck supple  Lungs- Clear to ausculation bilaterally, normal work of breathing.  No wheezes, rales, rhonchi Heart- Regular rate and rhythm, no murmurs, rubs or gallops  GI- soft, non-tender, non-distended, bowel sounds present  Extremities- no clubbing or cyanosis. No edema MS- no significant deformity or atrophy Skin- warm and dry, no rash or lesion; PPM  pocket well healed Psych- euthymic mood, full affect Neuro- strength and sensation are intact  PPM Interrogation- reviewed in detail today,  See PACEART report  EKG:  EKG is not ordered today.  Recent Labs: 01/02/2021: BUN 15; Creatinine, Ser 0.96; Hemoglobin 14.0; Platelets 197; Potassium 4.1; Sodium 140   Wt Readings from Last 3 Encounters:  03/27/21 174 lb 9.6 oz (79.2 kg)  01/02/21 172 lb 12.8 oz (78.4 kg)  12/02/20 171 lb 14.1 oz (78 kg)     Other studies Reviewed: Additional studies/ records that were reviewed today include: Previous EP office notes, Previous remote checks, Most recent labwork.   Assessment and Plan:  1. Symptomatic bradycardia s/p Medtronic PPM  Normal PPM function See Pace Art report No changes today  2.  CAD s/p CABG Denies s/s ischemia   3.  HTN Stable on current regimen    4. AFL Today he is back in AFL, for approximately 4 weeks now (Started back last week of November).  He declines AAD at this time. Discussed tikosyn vs amiodarone. (Likely amiodarone best option).  Will focus on rate control, and if has symptoms could revisit amio.  CHA2DS2/VASc is at least 5. Given his advanced age will continue his diltiazem without other medication changes at this time. He was asymptomatic, so if becomes a recurrent issue, rate control alone would not be unreasonable vs considering AAD.   Current medicines are reviewed at length with the patient today.    Labs/ tests ordered today include:  Orders Placed This Encounter  Procedures   Comprehensive metabolic panel   TSH     Disposition:   Follow up with in 6 months with EP MD. Could be with Camnitz/Lambert/Taylor with Dr. December leaving and not candidate for EP procedures other than AAD/DCC.    Johney Frame, PA-C  03/27/2021 9:44 AM  [facility] [address] [place] [phone] (office) [phone] (fax)

## 2021-03-27 ENCOUNTER — Ambulatory Visit: Payer: Medicare PPO | Admitting: Student

## 2021-03-27 ENCOUNTER — Encounter: Payer: Self-pay | Admitting: Student

## 2021-03-27 ENCOUNTER — Other Ambulatory Visit: Payer: Self-pay

## 2021-03-27 VITALS — BP 128/60 | HR 84 | Ht 70.0 in | Wt 174.6 lb

## 2021-03-27 DIAGNOSIS — I4892 Unspecified atrial flutter: Secondary | ICD-10-CM | POA: Diagnosis not present

## 2021-03-27 DIAGNOSIS — I25810 Atherosclerosis of coronary artery bypass graft(s) without angina pectoris: Secondary | ICD-10-CM | POA: Diagnosis not present

## 2021-03-27 DIAGNOSIS — I495 Sick sinus syndrome: Secondary | ICD-10-CM

## 2021-03-27 DIAGNOSIS — I10 Essential (primary) hypertension: Secondary | ICD-10-CM

## 2021-03-27 LAB — CUP PACEART INCLINIC DEVICE CHECK
Battery Impedance: 332 Ohm
Battery Remaining Longevity: 106 mo
Battery Voltage: 2.78 V
Brady Statistic AP VP Percent: 0 %
Brady Statistic AP VS Percent: 55 %
Brady Statistic AS VP Percent: 1 %
Brady Statistic AS VS Percent: 44 %
Date Time Interrogation Session: 20221222101814
Implantable Lead Implant Date: 20080319
Implantable Lead Implant Date: 20080319
Implantable Lead Location: 753859
Implantable Lead Location: 753860
Implantable Lead Model: 5076
Implantable Lead Model: 5076
Implantable Pulse Generator Implant Date: 20170711
Lead Channel Impedance Value: 436 Ohm
Lead Channel Impedance Value: 578 Ohm
Lead Channel Pacing Threshold Amplitude: 0.625 V
Lead Channel Pacing Threshold Amplitude: 0.75 V
Lead Channel Pacing Threshold Amplitude: 0.75 V
Lead Channel Pacing Threshold Pulse Width: 0.4 ms
Lead Channel Pacing Threshold Pulse Width: 0.4 ms
Lead Channel Pacing Threshold Pulse Width: 0.4 ms
Lead Channel Sensing Intrinsic Amplitude: 4 mV
Lead Channel Sensing Intrinsic Amplitude: 5.6 mV
Lead Channel Setting Pacing Amplitude: 2 V
Lead Channel Setting Pacing Amplitude: 2.5 V
Lead Channel Setting Pacing Pulse Width: 0.4 ms
Lead Channel Setting Sensing Sensitivity: 2 mV

## 2021-03-27 LAB — COMPREHENSIVE METABOLIC PANEL
ALT: 22 IU/L (ref 0–44)
AST: 28 IU/L (ref 0–40)
Albumin/Globulin Ratio: 1.7 (ref 1.2–2.2)
Albumin: 4.2 g/dL (ref 3.5–4.6)
Alkaline Phosphatase: 69 IU/L (ref 44–121)
BUN/Creatinine Ratio: 18 (ref 10–24)
BUN: 17 mg/dL (ref 10–36)
Bilirubin Total: 2 mg/dL — ABNORMAL HIGH (ref 0.0–1.2)
CO2: 24 mmol/L (ref 20–29)
Calcium: 9.8 mg/dL (ref 8.6–10.2)
Chloride: 101 mmol/L (ref 96–106)
Creatinine, Ser: 0.94 mg/dL (ref 0.76–1.27)
Globulin, Total: 2.5 g/dL (ref 1.5–4.5)
Glucose: 216 mg/dL — ABNORMAL HIGH (ref 70–99)
Potassium: 4.3 mmol/L (ref 3.5–5.2)
Sodium: 139 mmol/L (ref 134–144)
Total Protein: 6.7 g/dL (ref 6.0–8.5)
eGFR: 77 mL/min/{1.73_m2} (ref >59)

## 2021-03-27 LAB — TSH: TSH: 2.74 u[IU]/mL (ref 0.450–4.500)

## 2021-03-27 NOTE — Patient Instructions (Signed)
Medication Instructions:  Your physician recommends that you continue on your current medications as directed. Please refer to the Current Medication list given to you today.  *If you need a refill on your cardiac medications before your next appointment, please call your pharmacy*   Lab Work: TODAY: CMET, TSH  If you have labs (blood work) drawn today and your tests are completely normal, you will receive your results only by: MyChart Message (if you have MyChart) OR A paper copy in the mail If you have any lab test that is abnormal or we need to change your treatment, we will call you to review the results.   Follow-Up: At [facility], you and your health needs are our priority.  As part of our continuing mission to provide you with exceptional heart care, we have created designated Provider Care Teams.  These Care Teams include your primary Cardiologist (physician) and Advanced Practice Providers (APPs -  Physician Assistants and Nurse Practitioners) who all work together to provide you with the care you need, when you need it.  We recommend signing up for the patient portal called "MyChart".  Sign up information is provided on this After Visit Summary.  MyChart is used to connect with patients for Virtual Visits (Telemedicine).  Patients are able to view lab/test results, encounter notes, upcoming appointments, etc.  Non-urgent messages can be sent to your provider as well.   To learn more about what you can do with MyChart, go to [email or web address].    Your next appointment:   6 month(s)  The format for your next appointment:   In Person  Provider:   Steffanie Dunn, MD or Loman Brooklyn, MD

## 2021-04-08 ENCOUNTER — Ambulatory Visit (INDEPENDENT_AMBULATORY_CARE_PROVIDER_SITE_OTHER): Payer: Medicare PPO

## 2021-04-08 DIAGNOSIS — I495 Sick sinus syndrome: Secondary | ICD-10-CM

## 2021-04-08 LAB — CUP PACEART REMOTE DEVICE CHECK
Battery Impedance: 358 Ohm
Battery Remaining Longevity: 104 mo
Battery Voltage: 2.78 V
Brady Statistic AP VP Percent: 0 %
Brady Statistic AP VS Percent: 0 %
Brady Statistic AS VP Percent: 8 %
Brady Statistic AS VS Percent: 92 %
Date Time Interrogation Session: 20230103083325
Implantable Lead Implant Date: 20080319
Implantable Lead Implant Date: 20080319
Implantable Lead Location: 753859
Implantable Lead Location: 753860
Implantable Lead Model: 5076
Implantable Lead Model: 5076
Implantable Pulse Generator Implant Date: 20170711
Lead Channel Impedance Value: 443 Ohm
Lead Channel Impedance Value: 718 Ohm
Lead Channel Pacing Threshold Amplitude: 0.625 V
Lead Channel Pacing Threshold Amplitude: 0.75 V
Lead Channel Pacing Threshold Pulse Width: 0.4 ms
Lead Channel Pacing Threshold Pulse Width: 0.4 ms
Lead Channel Setting Pacing Amplitude: 2 V
Lead Channel Setting Pacing Amplitude: 2.5 V
Lead Channel Setting Pacing Pulse Width: 0.4 ms
Lead Channel Setting Sensing Sensitivity: 2 mV

## 2021-04-16 NOTE — Progress Notes (Signed)
Remote pacemaker transmission.   

## 2021-05-30 DIAGNOSIS — Z7901 Long term (current) use of anticoagulants: Secondary | ICD-10-CM | POA: Diagnosis not present

## 2021-05-30 DIAGNOSIS — R32 Unspecified urinary incontinence: Secondary | ICD-10-CM | POA: Diagnosis not present

## 2021-05-30 DIAGNOSIS — K219 Gastro-esophageal reflux disease without esophagitis: Secondary | ICD-10-CM | POA: Diagnosis not present

## 2021-05-30 DIAGNOSIS — E785 Hyperlipidemia, unspecified: Secondary | ICD-10-CM | POA: Diagnosis not present

## 2021-05-30 DIAGNOSIS — I10 Essential (primary) hypertension: Secondary | ICD-10-CM | POA: Diagnosis not present

## 2021-05-30 DIAGNOSIS — E119 Type 2 diabetes mellitus without complications: Secondary | ICD-10-CM | POA: Diagnosis not present

## 2021-05-30 DIAGNOSIS — Z7984 Long term (current) use of oral hypoglycemic drugs: Secondary | ICD-10-CM | POA: Diagnosis not present

## 2021-05-30 DIAGNOSIS — I2510 Atherosclerotic heart disease of native coronary artery without angina pectoris: Secondary | ICD-10-CM | POA: Diagnosis not present

## 2021-05-30 DIAGNOSIS — I252 Old myocardial infarction: Secondary | ICD-10-CM | POA: Diagnosis not present

## 2021-06-02 DIAGNOSIS — I480 Paroxysmal atrial fibrillation: Secondary | ICD-10-CM | POA: Diagnosis not present

## 2021-06-02 DIAGNOSIS — I10 Essential (primary) hypertension: Secondary | ICD-10-CM | POA: Diagnosis not present

## 2021-06-02 DIAGNOSIS — I2510 Atherosclerotic heart disease of native coronary artery without angina pectoris: Secondary | ICD-10-CM | POA: Diagnosis not present

## 2021-06-02 DIAGNOSIS — I739 Peripheral vascular disease, unspecified: Secondary | ICD-10-CM | POA: Diagnosis not present

## 2021-06-02 DIAGNOSIS — E785 Hyperlipidemia, unspecified: Secondary | ICD-10-CM | POA: Diagnosis not present

## 2021-06-02 DIAGNOSIS — R072 Precordial pain: Secondary | ICD-10-CM | POA: Diagnosis not present

## 2021-06-02 DIAGNOSIS — E1151 Type 2 diabetes mellitus with diabetic peripheral angiopathy without gangrene: Secondary | ICD-10-CM | POA: Diagnosis not present

## 2021-06-02 DIAGNOSIS — Z950 Presence of cardiac pacemaker: Secondary | ICD-10-CM | POA: Diagnosis not present

## 2021-06-12 ENCOUNTER — Ambulatory Visit (INDEPENDENT_AMBULATORY_CARE_PROVIDER_SITE_OTHER): Payer: Medicare PPO | Admitting: Internal Medicine

## 2021-06-12 ENCOUNTER — Other Ambulatory Visit: Payer: Self-pay

## 2021-06-12 VITALS — BP 152/90 | HR 75 | Ht 70.0 in | Wt 174.0 lb

## 2021-06-12 DIAGNOSIS — I4892 Unspecified atrial flutter: Secondary | ICD-10-CM | POA: Diagnosis not present

## 2021-06-12 DIAGNOSIS — I25810 Atherosclerosis of coronary artery bypass graft(s) without angina pectoris: Secondary | ICD-10-CM | POA: Diagnosis not present

## 2021-06-12 DIAGNOSIS — D6869 Other thrombophilia: Secondary | ICD-10-CM

## 2021-06-12 DIAGNOSIS — I10 Essential (primary) hypertension: Secondary | ICD-10-CM

## 2021-06-12 MED ORDER — NITROGLYCERIN 0.4 MG SL SUBL: 0.4000 mg | SUBLINGUAL_TABLET | SUBLINGUAL | 3 refills | Status: DC | PRN

## 2021-06-12 NOTE — Patient Instructions (Addendum)
Medication Instructions:  ?Your physician recommends that you continue on your current medications as directed. Please refer to the Current Medication list given to you today. ? ?You have been prescribed nitroglycerin to take as needed for chest pain. ? ?In an angina attack, you should feel better within 5 minutes after your first dose. You can take a dose every 5 minutes up to a total of 3 doses. If you do not feel better or feel worse after 1 dose, call 9-1-1 at once. Do not take more than 3 doses in 15 minutes. Your care team might give you other directions. Follow those directions if they do. Do not take your medication more often than directed. ? ?Labwork: ?None ordered. ? ?Testing/Procedures: ?None ordered. ? ?Follow-Up: ?Your physician wants you to follow-up in: 6 months with Gillian Shields.    ?You will receive a reminder letter in the mail two months in advance. If you don't receive a letter, please call our office to schedule the follow-up appointment. ? ? ?Your physician wants you to follow-up in: one year with Dr. Johney Frame.  You will receive a reminder letter in the mail two months in advance. If you don't receive a letter, please call our office to schedule the follow-up appointment. ? ? ? ?Any Other Special Instructions Will Be Listed Below (If Applicable). ? ?If you need a refill on your cardiac medications before your next appointment, please call your pharmacy.  ? ? ? ? ?

## 2021-06-12 NOTE — Progress Notes (Signed)
? ? ? ?PCP: Donnajean Lopes, MD ?  ?Primary EP:  Dr Rayann Heman ? ?Peter Reynolds is a 88 y.o. male who presents today for routine electrophysiology followup.  Since last being seen in our clinic, the patient reports doing very well.  He is active and is able to walk a mile each day without limitation.  He has occasional CP, primarily at night.  He attributes this to indigestion and does report some waterbrash sensation.   Today, he denies symptoms of palpitations, exertional chest pain, shortness of breath,  lower extremity edema, dizziness, presyncope, or syncope.  The patient is otherwise without complaint today.  ? ?Past Medical History:  ?Diagnosis Date  ? CAD   ? s/p CABG  ? Diabetes mellitus   ? Dyslipidemia   ? Essential hypertension, benign   ? Hematuria   ? RBBB   ? Sick sinus syndrome [facility])   ? s/p PPM (MDT) by Dr Verlon Setting 2008  ? ?Past Surgical History:  ?Procedure Laterality Date  ? Coronary artery bypass grafting.    ? lima-lad,svg-OM1-2,svg-rca  ? Dual-chamber permanent pacemaker implant 2008  06/23/2006  ? MDT Adapta implanted by Dr Verlon Setting  ? EP IMPLANTABLE DEVICE N/A 10/15/2015  ? Procedure:  PPM Generator Changeout;  Surgeon: Thompson Grayer, MD;  Location: [place];  Service: Cardiovascular;  Laterality: N/A;  ? HAND SURGERY  2004  ? HERNIA REPAIR  1995  ? right  ? ? ?ROS- all systems are reviewed and negative except as per HPI above ? ?Current Outpatient Medications  ?Medication Sig Dispense Refill  ? apixaban (ELIQUIS) 5 MG TABS tablet Take 1 tablet (5 mg total) by mouth 2 (two) times daily. 60 tablet 0  ? diltiazem (TIAZAC) 360 MG 24 hr capsule Take 360 mg by mouth daily.     ? lisinopril-hydrochlorothiazide (PRINZIDE,ZESTORETIC) 20-12.5 MG per tablet Take 1 tablet by mouth daily.     ? metFORMIN (GLUCOPHAGE-XR) 500 MG 24 hr tablet Take 1,000 mg by mouth daily with supper.     ? Multiple Vitamin (MULTI-VITAMIN PO) Take 1 tablet by mouth daily.     ? pravastatin (PRAVACHOL) 40 MG  tablet Take 40 mg by mouth at bedtime.     ? VITAMIN E PO Take 400 Units by mouth daily.     ? ?No current facility-administered medications for this visit.  ? ? ?Physical Exam: ?Vitals:  ? 06/12/21 1007  ?BP: (!) 152/90  ?Pulse: 75  ?SpO2: 98%  ?Weight: 178 lb (80.7 kg)  ?Height: 5\' 10"  (1.778 m)  ? ? ?GEN- The patient is well appearing, alert and oriented x 3 today.   ?Head- normocephalic, atraumatic ?Eyes-  Sclera clear, conjunctiva pink ?Ears- hearing intact ?Oropharynx- clear ?Lungs- Clear to ausculation bilaterally, normal work of breathing ?Chest- pacemaker pocket is well healed ?Heart- Regular rate and rhythm, no murmurs, rubs or gallops, PMI not laterally displaced ?GI- soft, NT, ND, + BS ?Extremities- no clubbing, cyanosis, or edema ? ?Pacemaker remotes- reviewed in detail today,  See PACEART report ? ?ekg tracing ordered today is personally reviewed and shows atrial flutter, V paced ? ?Assessment and Plan: ? ?1. Symptomatic bradycardia  ?Normal pacemaker function by remotes ?See Claudia Desanctis Art report ?No changes today ? ?2. CAD s/p CABG ?Stable canadian class II angina ?I have advised initiation of beta blocker which he declines today ? ?3. HTN ?Elevated today ?Declines beta blocker as above ? ?4. Atrial flutter ?Pt prefers rate control as long term strategy.  Continue diltiazem ?Remote histograms reveal some V rate elevations.  I have advised initiation of beta blocker which he declines. ? ?Risks, benefits and potential toxicities for medications prescribed and/or refilled reviewed with patient today.  ? ?Return in 6 months to establish with cardiology team.  I will see for EP in a year ? ?Thompson Grayer MD, FACC ?06/12/2021 ?10:19 AM ? ? ?

## 2021-06-17 DIAGNOSIS — H40053 Ocular hypertension, bilateral: Secondary | ICD-10-CM | POA: Diagnosis not present

## 2021-06-17 DIAGNOSIS — H402221 Chronic angle-closure glaucoma, left eye, mild stage: Secondary | ICD-10-CM | POA: Diagnosis not present

## 2021-06-17 DIAGNOSIS — H402212 Chronic angle-closure glaucoma, right eye, moderate stage: Secondary | ICD-10-CM | POA: Diagnosis not present

## 2021-07-07 DIAGNOSIS — H402212 Chronic angle-closure glaucoma, right eye, moderate stage: Secondary | ICD-10-CM | POA: Diagnosis not present

## 2021-07-24 ENCOUNTER — Ambulatory Visit (INDEPENDENT_AMBULATORY_CARE_PROVIDER_SITE_OTHER): Payer: Medicare PPO

## 2021-07-24 DIAGNOSIS — I495 Sick sinus syndrome: Secondary | ICD-10-CM | POA: Diagnosis not present

## 2021-07-24 LAB — CUP PACEART REMOTE DEVICE CHECK
Battery Impedance: 382 Ohm
Battery Remaining Longevity: 100 mo
Battery Voltage: 2.78 V
Date Time Interrogation Session: 20230420094837
Implantable Lead Implant Date: 20080319
Implantable Lead Implant Date: 20080319
Implantable Lead Location: 753859
Implantable Lead Location: 753860
Implantable Lead Model: 5076
Implantable Lead Model: 5076
Implantable Pulse Generator Implant Date: 20170711
Lead Channel Impedance Value: 431 Ohm
Lead Channel Impedance Value: 526 Ohm
Lead Channel Pacing Threshold Amplitude: 0.625 V
Lead Channel Pacing Threshold Amplitude: 0.75 V
Lead Channel Pacing Threshold Pulse Width: 0.4 ms
Lead Channel Pacing Threshold Pulse Width: 0.4 ms
Lead Channel Setting Pacing Amplitude: 2 V
Lead Channel Setting Pacing Amplitude: 2.5 V
Lead Channel Setting Pacing Pulse Width: 0.4 ms
Lead Channel Setting Sensing Sensitivity: 2 mV

## 2021-07-29 DIAGNOSIS — H402221 Chronic angle-closure glaucoma, left eye, mild stage: Secondary | ICD-10-CM | POA: Diagnosis not present

## 2021-08-08 NOTE — Progress Notes (Signed)
Remote pacemaker transmission.   

## 2021-08-13 DIAGNOSIS — H402221 Chronic angle-closure glaucoma, left eye, mild stage: Secondary | ICD-10-CM | POA: Diagnosis not present

## 2021-08-13 DIAGNOSIS — H40053 Ocular hypertension, bilateral: Secondary | ICD-10-CM | POA: Diagnosis not present

## 2021-08-13 DIAGNOSIS — H402212 Chronic angle-closure glaucoma, right eye, moderate stage: Secondary | ICD-10-CM | POA: Diagnosis not present

## 2021-09-08 DIAGNOSIS — I2510 Atherosclerotic heart disease of native coronary artery without angina pectoris: Secondary | ICD-10-CM | POA: Diagnosis not present

## 2021-09-08 DIAGNOSIS — E1151 Type 2 diabetes mellitus with diabetic peripheral angiopathy without gangrene: Secondary | ICD-10-CM | POA: Diagnosis not present

## 2021-09-08 DIAGNOSIS — I739 Peripheral vascular disease, unspecified: Secondary | ICD-10-CM | POA: Diagnosis not present

## 2021-09-08 DIAGNOSIS — Z950 Presence of cardiac pacemaker: Secondary | ICD-10-CM | POA: Diagnosis not present

## 2021-09-08 DIAGNOSIS — I480 Paroxysmal atrial fibrillation: Secondary | ICD-10-CM | POA: Diagnosis not present

## 2021-09-08 DIAGNOSIS — R42 Dizziness and giddiness: Secondary | ICD-10-CM | POA: Diagnosis not present

## 2021-09-08 DIAGNOSIS — E785 Hyperlipidemia, unspecified: Secondary | ICD-10-CM | POA: Diagnosis not present

## 2021-09-08 DIAGNOSIS — I10 Essential (primary) hypertension: Secondary | ICD-10-CM | POA: Diagnosis not present

## 2021-10-14 DIAGNOSIS — L821 Other seborrheic keratosis: Secondary | ICD-10-CM | POA: Diagnosis not present

## 2021-10-14 DIAGNOSIS — L723 Sebaceous cyst: Secondary | ICD-10-CM | POA: Diagnosis not present

## 2021-10-14 DIAGNOSIS — D2372 Other benign neoplasm of skin of left lower limb, including hip: Secondary | ICD-10-CM | POA: Diagnosis not present

## 2021-10-14 DIAGNOSIS — Z85828 Personal history of other malignant neoplasm of skin: Secondary | ICD-10-CM | POA: Diagnosis not present

## 2021-10-14 DIAGNOSIS — L570 Actinic keratosis: Secondary | ICD-10-CM | POA: Diagnosis not present

## 2021-10-14 DIAGNOSIS — D1801 Hemangioma of skin and subcutaneous tissue: Secondary | ICD-10-CM | POA: Diagnosis not present

## 2021-11-24 DIAGNOSIS — R7989 Other specified abnormal findings of blood chemistry: Secondary | ICD-10-CM | POA: Diagnosis not present

## 2021-11-24 DIAGNOSIS — E1151 Type 2 diabetes mellitus with diabetic peripheral angiopathy without gangrene: Secondary | ICD-10-CM | POA: Diagnosis not present

## 2021-11-24 DIAGNOSIS — E785 Hyperlipidemia, unspecified: Secondary | ICD-10-CM | POA: Diagnosis not present

## 2021-11-24 DIAGNOSIS — I10 Essential (primary) hypertension: Secondary | ICD-10-CM | POA: Diagnosis not present

## 2021-11-24 DIAGNOSIS — Z0000 Encounter for general adult medical examination without abnormal findings: Secondary | ICD-10-CM | POA: Diagnosis not present

## 2021-11-25 ENCOUNTER — Ambulatory Visit (INDEPENDENT_AMBULATORY_CARE_PROVIDER_SITE_OTHER): Payer: Medicare PPO

## 2021-11-25 DIAGNOSIS — I495 Sick sinus syndrome: Secondary | ICD-10-CM

## 2021-11-25 LAB — CUP PACEART REMOTE DEVICE CHECK
Battery Impedance: 432 Ohm
Battery Remaining Longevity: 95 mo
Battery Voltage: 2.78 V
Brady Statistic AP VP Percent: 0 %
Brady Statistic AP VS Percent: 0 %
Brady Statistic AS VP Percent: 12 %
Brady Statistic AS VS Percent: 88 %
Date Time Interrogation Session: 20230821151619
Implantable Lead Implant Date: 20080319
Implantable Lead Implant Date: 20080319
Implantable Lead Location: 753859
Implantable Lead Location: 753860
Implantable Lead Model: 5076
Implantable Lead Model: 5076
Implantable Pulse Generator Implant Date: 20170711
Lead Channel Impedance Value: 425 Ohm
Lead Channel Impedance Value: 514 Ohm
Lead Channel Pacing Threshold Amplitude: 0.625 V
Lead Channel Pacing Threshold Amplitude: 0.625 V
Lead Channel Pacing Threshold Pulse Width: 0.4 ms
Lead Channel Pacing Threshold Pulse Width: 0.4 ms
Lead Channel Setting Pacing Amplitude: 2 V
Lead Channel Setting Pacing Amplitude: 2.5 V
Lead Channel Setting Pacing Pulse Width: 0.4 ms
Lead Channel Setting Sensing Sensitivity: 2 mV

## 2021-12-01 DIAGNOSIS — I480 Paroxysmal atrial fibrillation: Secondary | ICD-10-CM | POA: Diagnosis not present

## 2021-12-01 DIAGNOSIS — E785 Hyperlipidemia, unspecified: Secondary | ICD-10-CM | POA: Diagnosis not present

## 2021-12-01 DIAGNOSIS — Z0000 Encounter for general adult medical examination without abnormal findings: Secondary | ICD-10-CM | POA: Diagnosis not present

## 2021-12-01 DIAGNOSIS — I739 Peripheral vascular disease, unspecified: Secondary | ICD-10-CM | POA: Diagnosis not present

## 2021-12-01 DIAGNOSIS — I2510 Atherosclerotic heart disease of native coronary artery without angina pectoris: Secondary | ICD-10-CM | POA: Diagnosis not present

## 2021-12-01 DIAGNOSIS — R82998 Other abnormal findings in urine: Secondary | ICD-10-CM | POA: Diagnosis not present

## 2021-12-01 DIAGNOSIS — K219 Gastro-esophageal reflux disease without esophagitis: Secondary | ICD-10-CM | POA: Diagnosis not present

## 2021-12-01 DIAGNOSIS — Z950 Presence of cardiac pacemaker: Secondary | ICD-10-CM | POA: Diagnosis not present

## 2021-12-01 DIAGNOSIS — I10 Essential (primary) hypertension: Secondary | ICD-10-CM | POA: Diagnosis not present

## 2021-12-01 DIAGNOSIS — E1151 Type 2 diabetes mellitus with diabetic peripheral angiopathy without gangrene: Secondary | ICD-10-CM | POA: Diagnosis not present

## 2021-12-17 DIAGNOSIS — Z961 Presence of intraocular lens: Secondary | ICD-10-CM | POA: Diagnosis not present

## 2021-12-17 DIAGNOSIS — H26493 Other secondary cataract, bilateral: Secondary | ICD-10-CM | POA: Diagnosis not present

## 2021-12-17 DIAGNOSIS — H402221 Chronic angle-closure glaucoma, left eye, mild stage: Secondary | ICD-10-CM | POA: Diagnosis not present

## 2021-12-17 DIAGNOSIS — E119 Type 2 diabetes mellitus without complications: Secondary | ICD-10-CM | POA: Diagnosis not present

## 2021-12-17 DIAGNOSIS — H402212 Chronic angle-closure glaucoma, right eye, moderate stage: Secondary | ICD-10-CM | POA: Diagnosis not present

## 2021-12-17 DIAGNOSIS — H04123 Dry eye syndrome of bilateral lacrimal glands: Secondary | ICD-10-CM | POA: Diagnosis not present

## 2021-12-18 DIAGNOSIS — H6123 Impacted cerumen, bilateral: Secondary | ICD-10-CM | POA: Diagnosis not present

## 2021-12-18 DIAGNOSIS — H9193 Unspecified hearing loss, bilateral: Secondary | ICD-10-CM | POA: Diagnosis not present

## 2021-12-19 ENCOUNTER — Other Ambulatory Visit: Payer: Self-pay | Admitting: Student

## 2021-12-19 DIAGNOSIS — I4891 Unspecified atrial fibrillation: Secondary | ICD-10-CM

## 2021-12-19 NOTE — Telephone Encounter (Signed)
Prescription refill request for Eliquis received. Indication:Afib  Last office visit: 06/12/21 ([facility])  Scr: 0.94 (03/27/21) Age: 88 Weight: 78.9kg  Appropriate dose and refill sent to requested pharmacy.

## 2021-12-23 NOTE — Progress Notes (Signed)
Remote pacemaker transmission.   

## 2022-01-01 ENCOUNTER — Ambulatory Visit (HOSPITAL_BASED_OUTPATIENT_CLINIC_OR_DEPARTMENT_OTHER): Payer: Medicare PPO | Admitting: Family

## 2022-01-01 ENCOUNTER — Encounter (HOSPITAL_BASED_OUTPATIENT_CLINIC_OR_DEPARTMENT_OTHER): Payer: Self-pay | Admitting: Family

## 2022-01-01 VITALS — BP 124/76 | HR 93 | Ht 70.0 in | Wt 172.0 lb

## 2022-01-01 DIAGNOSIS — I10 Essential (primary) hypertension: Secondary | ICD-10-CM

## 2022-01-01 DIAGNOSIS — I25118 Atherosclerotic heart disease of native coronary artery with other forms of angina pectoris: Secondary | ICD-10-CM

## 2022-01-01 DIAGNOSIS — D6859 Other primary thrombophilia: Secondary | ICD-10-CM

## 2022-01-01 DIAGNOSIS — E785 Hyperlipidemia, unspecified: Secondary | ICD-10-CM | POA: Diagnosis not present

## 2022-01-01 LAB — CBC
Hematocrit: 42.3 % (ref 37.5–51.0)
Hemoglobin: 14.6 g/dL (ref 13.0–17.7)
MCH: 30.8 pg (ref 26.6–33.0)
MCHC: 34.5 g/dL (ref 31.5–35.7)
MCV: 89 fL (ref 79–97)
Platelets: 200 10*3/uL (ref 150–450)
RBC: 4.74 x10E6/uL (ref 4.14–5.80)
RDW: 12.3 % (ref 11.6–15.4)
WBC: 7.1 10*3/uL (ref 3.4–10.8)

## 2022-01-01 NOTE — Patient Instructions (Addendum)
Medication Instructions:  Your Physician recommend you continue on your current medication as directed.    *If you need a refill on your cardiac medications before your next appointment, please call your pharmacy*   Lab Work: Your physician recommends that you return for lab work today- CBC   If you have labs (blood work) drawn today and your tests are completely normal, you will receive your results only by: MyChart Message (if you have MyChart) OR A paper copy in the mail If you have any lab test that is abnormal or we need to change your treatment, we will call you to review the results.  Follow-Up: At [facility], you and your health needs are our priority.  As part of our continuing mission to provide you with exceptional heart care, we have created designated Provider Care Teams.  These Care Teams include your primary Cardiologist (physician) and Advanced Practice Providers (APPs -  Physician Assistants and Nurse Practitioners) who all work together to provide you with the care you need, when you need it.  We recommend signing up for the patient portal called "MyChart".  Sign up information is provided on this After Visit Summary.  MyChart is used to connect with patients for Virtual Visits (Telemedicine).  Patients are able to view lab/test results, encounter notes, upcoming appointments, etc.  Non-urgent messages can be sent to your provider as well.   To learn more about what you can do with MyChart, go to [email or web address].    Your next appointment:   71mo with [facility] EP APP    & 1 year with Dr. Martinique or Laurann Montana, NP   Other Instructions [place] supplement

## 2022-01-01 NOTE — Progress Notes (Signed)
Office Visit    Patient Name: Peter Reynolds Date of Encounter: 01/01/2022  PCP:  Donnajean Lopes, MD   [place]  Cardiologist:  None  Advanced Practice Provider:  No care team member to display Electrophysiologist:  Thompson Grayer, MD      Chief Complaint    Peter Reynolds is a 88 y.o. male presents today for 6 month follow up of CAD   Past Medical History    Past Medical History:  Diagnosis Date   CAD    s/p CABG   Diabetes mellitus    Dyslipidemia    Essential hypertension, benign    Hematuria    RBBB    Sick sinus syndrome [facility])    s/p PPM (MDT) by Dr Verlon Setting 2008   Past Surgical History:  Procedure Laterality Date   Coronary artery bypass grafting.     lima-lad,svg-OM1-2,svg-rca   Dual-chamber permanent pacemaker implant 2008  06/23/2006   MDT Adapta implanted by Dr Verlon Setting   EP IMPLANTABLE DEVICE N/A 10/15/2015   Procedure:  [place];  Surgeon: Thompson Grayer, MD;  Location: [facility];  Service: Cardiovascular;  Laterality: N/A;   HAND SURGERY  2004   HERNIA REPAIR  1995   right    Allergies  No Known Allergies  History of Present Illness    Peter Reynolds is a 88 y.o. male with a hx of CAD s/p CABG 1996 (lima-lad,svg-OM1-2,svg-rca), HLD, SSS s/p PPM, DM2, HTN, RBBB last seen 06/12/21 by Dr. Rayann Heman.  At last visit 06/12/21 by Dr. Rayann Heman was recommended to add beta blocker due to some ventricular rate elevations  and for secondary prevention of coronary artery disease which he declined.   Presents today for follow up. Retired Social research officer, government. Walks 1 mile per day for exercise. Eats at home and out but tries to follow a heart healthy diet. Reports no shortness of breath nor dyspnea on exertion. Reports no chest pain, pressure, or tightness. No edema, orthopnea, PND. Reports no palpitations.    EKGs/Labs/Other Studies Reviewed:   The following studies were reviewed today:  EKG:  No  EKG today.   Recent Labs: 01/02/2021: Hemoglobin 14.0; Platelets 197 03/27/2021: ALT 22; BUN 17; Creatinine, Ser 0.94; Potassium 4.3; Sodium 139; TSH 2.740  Recent Lipid Panel No results found for: "CHOL", "TRIG", "HDL", "CHOLHDL", "VLDL", "LDLCALC", "LDLDIRECT"  Risk Assessment/Calculations:   CHA2DS2-VASc Score = 5   This indicates a 7.2% annual risk of stroke. The patient's score is based upon: CHF History: 0 HTN History: 1 Diabetes History: 1 Stroke History: 0 Vascular Disease History: 1 Age Score: 2 Gender Score: 0   Home Medications   Current Meds  Medication Sig   apixaban (ELIQUIS) 5 MG TABS tablet TAKE 1 TABLET TWICE DAILY   diltiazem (TIAZAC) 360 MG 24 hr capsule Take 360 mg by mouth daily.    lisinopril-hydrochlorothiazide (PRINZIDE,ZESTORETIC) 20-12.5 MG per tablet Take 1 tablet by mouth daily.    metFORMIN (GLUCOPHAGE-XR) 500 MG 24 hr tablet Take 1,000 mg by mouth daily with supper.    Multiple Vitamin (MULTI-VITAMIN PO) Take 1 tablet by mouth daily.    pravastatin (PRAVACHOL) 40 MG tablet Take 40 mg by mouth at bedtime.    VITAMIN E PO Take 400 Units by mouth daily.      Review of Systems      All other systems reviewed and are otherwise negative except as noted above.  Physical Exam  VS:  BP 124/76   Pulse 93   Ht 5\' 10"  (1.778 m)   Wt 172 lb (78 kg)   BMI 24.68 kg/m  , BMI Body mass index is 24.68 kg/m.  Wt Readings from Last 3 Encounters:  01/01/22 172 lb (78 kg)  06/12/21 174 lb (78.9 kg)  03/27/21 174 lb 9.6 oz (79.2 kg)    GEN: Well nourished, well developed, in no acute distress. HEENT: normal. Neck: Supple, no JVD, carotid bruits, or masses. Cardiac: RRR, no murmurs, rubs, or gallops. No clubbing, cyanosis, edema.  Radials/PT 2+ and equal bilaterally.  Respiratory:  Respirations regular and unlabored, clear to auscultation bilaterally. GI: Soft, nontender, nondistended. MS: No deformity or atrophy. Skin: Warm and dry, no rash. Neuro:   Strength and sensation are intact. Psych: Normal affect.  Assessment & Plan    Symptomatic s/p PPM - Reports no recent palpitations. Prefer to avoid beta blocker at this time. Follows with electrophysiology - will transition to Dr. Myles Gip due to Dr. Jackalyn Lombard departure.   CAD s/p CABG / HLD, LDL goal <70 - 11/2021 LDL 45. GDMT Pravastatin. No aspirin due to [facility]. Heart healthy diet and regular cardiovascular exercise encouraged.    HTN -BP well controlled. Continue current antihypertensive regimen.    Atrial flutter / Hypercoagulable state - Pt prefers rate control. Continue Eliquis 5mg  BID. Denies bleeding complications. CHA2DS2-VASc Score = 5 [CHF History: 0, HTN History: 1, Diabetes History: 1, Stroke History: 0, Vascular Disease History: 1, Age Score: 2, Gender Score: 0].  Therefore, the patient's annual risk of stroke is 7.2 %.   Update  CBC.        Disposition: Follow up in 6 month(s) with Dr. Myles Gip or Oda Kilts, [place] and in 1 year with Dr. [place] or Loel Dubonnet, NP   Signed, Loel Dubonnet, NP 01/01/2022, 1:46 PM [place]

## 2022-01-02 ENCOUNTER — Telehealth (HOSPITAL_BASED_OUTPATIENT_CLINIC_OR_DEPARTMENT_OTHER): Payer: Self-pay

## 2022-01-02 ENCOUNTER — Encounter (HOSPITAL_BASED_OUTPATIENT_CLINIC_OR_DEPARTMENT_OTHER): Payer: Self-pay | Admitting: Family

## 2022-01-02 NOTE — Telephone Encounter (Addendum)
Results called to patient who verbalizes understanding!     ----- Message from Loel Dubonnet, NP sent at 01/02/2022  7:22 AM EDT ----- CBC with no evidence of anemia nor infection.

## 2022-01-20 DIAGNOSIS — H402221 Chronic angle-closure glaucoma, left eye, mild stage: Secondary | ICD-10-CM | POA: Diagnosis not present

## 2022-01-20 DIAGNOSIS — H402212 Chronic angle-closure glaucoma, right eye, moderate stage: Secondary | ICD-10-CM | POA: Diagnosis not present

## 2022-01-21 DIAGNOSIS — E1151 Type 2 diabetes mellitus with diabetic peripheral angiopathy without gangrene: Secondary | ICD-10-CM | POA: Diagnosis not present

## 2022-01-21 DIAGNOSIS — L03011 Cellulitis of right finger: Secondary | ICD-10-CM | POA: Diagnosis not present

## 2022-01-23 ENCOUNTER — Ambulatory Visit (INDEPENDENT_AMBULATORY_CARE_PROVIDER_SITE_OTHER): Payer: Medicare PPO | Admitting: Orthopaedic Surgery

## 2022-01-23 ENCOUNTER — Encounter: Payer: Self-pay | Admitting: Orthopaedic Surgery

## 2022-01-23 DIAGNOSIS — L03011 Cellulitis of right finger: Secondary | ICD-10-CM

## 2022-01-23 MED ORDER — AMOXICILLIN-POT CLAVULANATE 875-125 MG PO TABS: 1.0000 | ORAL_TABLET | Freq: Two times a day (BID) | ORAL | 0 refills | Status: AC

## 2022-01-23 NOTE — Progress Notes (Signed)
Office Visit Note   Patient: Peter Reynolds           Date of Birth: Jan 01, 1929           MRN: [id number] Visit Date: 01/23/2022              Requested by: Donnajean Lopes, MD [place],  [place] [address] PCP: Donnajean Lopes, MD   Assessment & Plan: Visit Diagnoses:  1. Paronychia of right thumb     Plan: Impression is right thumb paronychia with adequate response from doxycycline.  I would like to switch him over to Augmentin.  I recommended probiotics to help while he is on Augmentin.  I recommend that he follow-up in a week for recheck if he has any more concerns but if it is fully resolved he can just call and cancel the appointment.  Follow-Up Instructions: Return in about 1 week (around 01/30/2022).   Orders:  No orders of the defined types were placed in this encounter.  Meds ordered this encounter  Medications   amoxicillin-clavulanate (AUGMENTIN) 875-125 MG tablet    Sig: Take 1 tablet by mouth 2 (two) times daily for 10 days.    Dispense:  20 tablet    Refill:  0      Procedures: No procedures performed   Clinical Data: No additional findings.   Subjective: Chief Complaint  Patient presents with   Right Thumb - Pain    HPI Peter Reynolds is a very pleasant 88 year old gentleman referral from PCP for evaluation of right thumb infection.  He noticed some swelling and pain for few days ago and his PCP placed him on doxycycline.  He has soaked it in peroxide.  Who states that the finger was originally quite swollen but has improved since being on doxycycline. Review of Systems  Constitutional: Negative.   All other systems reviewed and are negative.    Objective: Vital Signs: There were no vitals taken for this visit.  Physical Exam Vitals and nursing note reviewed.  Constitutional:      Appearance: He is well-developed.  HENT:     Head: Normocephalic and atraumatic.  Eyes:     Pupils: Pupils are equal, round, and  reactive to light.  Pulmonary:     Effort: Pulmonary effort is normal.  Abdominal:     Palpations: Abdomen is soft.  Musculoskeletal:        General: Normal range of motion.     Cervical back: Neck supple.  Skin:    General: Skin is warm.  Neurological:     Mental Status: He is alert and oriented to person, place, and time.  Psychiatric:        Behavior: Behavior normal.        Thought Content: Thought content normal.        Judgment: Judgment normal.     Ortho Exam Examination of the right thumb shows acute paronychial infection with surrounding redness.  There is no a sending cellulitis.  No evidence of felon infection.  No evidence of flexor tenosynovitis. Specialty Comments:  No specialty comments available.  Imaging: No results found.   PMFS History: Patient Active Problem List   Diagnosis Date Noted   Paronychia of right thumb 01/23/2022   Sick sinus syndrome ([place]) 05/24/2013   Hyperlipidemia 08/07/2011   Atrial fibrillation ([place]) 05/11/2011   ESSENTIAL HYPERTENSION, BENIGN 05/09/2010   Coronary atherosclerosis 05/09/2010   ATRIOVENTRICULAR BLOCK, 2ND DEGREE 05/09/2010   Past Medical History:  Diagnosis  Date   CAD    s/p CABG   Diabetes mellitus    Dyslipidemia    Essential hypertension, benign    Hematuria    RBBB    Sick sinus syndrome [facility])    s/p PPM (MDT) by Dr Reyes Ivan 2008    Family History  Problem Relation Age of Onset   Hypertension Mother    Heart disease Father    Heart disease Brother     Past Surgical History:  Procedure Laterality Date   Coronary artery bypass grafting.     lima-lad,svg-OM1-2,svg-rca   Dual-chamber permanent pacemaker implant 2008  06/23/2006   MDT Adapta implanted by Dr Reyes Ivan   EP IMPLANTABLE DEVICE N/A 10/15/2015   Procedure:  PPM Generator Changeout;  Surgeon: Hillis Range, MD;  Location: [facility];  Service: Cardiovascular;  Laterality: N/A;   HAND SURGERY  2004   HERNIA REPAIR  1995   right   Social  History   Occupational History   Occupation: Runner, broadcasting/film/video    Comment: retired  Tobacco Use   Smoking status: Never   Smokeless tobacco: Never  Substance and Sexual Activity   Alcohol use: No   Drug use: No   Sexual activity: Not on file

## 2022-01-30 ENCOUNTER — Ambulatory Visit: Payer: Medicare PPO | Admitting: Orthopaedic Surgery

## 2022-01-30 DIAGNOSIS — L03011 Cellulitis of right finger: Secondary | ICD-10-CM

## 2022-01-30 NOTE — Progress Notes (Signed)
   Office Visit Note   Patient: Peter Reynolds           Date of Birth: 1928/09/20           MRN: [id number] Visit Date: 01/30/2022              Requested by: Donnajean Lopes, MD [place],  [place] [address] PCP: Donnajean Lopes, MD   Assessment & Plan: Visit Diagnoses:  1. Paronychia of right thumb     Plan: [name] returns today for recheck of his right thumb paronychia.  He has been on Augmentin.  He states that feels much better.  Examination of the right thumb shows a resolving paronychia.  He has had a great response to the Augmentin.  He is taking probiotics.  I explained that the sensitivity is normal to experience for a few months after the an infection like this.  He will finish out the remaining 4 days of Augmentin.  Follow-up if there is any problems otherwise as needed.  Follow-Up Instructions: No follow-ups on file.   Orders:  No orders of the defined types were placed in this encounter.  No orders of the defined types were placed in this encounter.     Procedures: No procedures performed   Clinical Data: No additional findings.   Subjective: Chief Complaint  Patient presents with   Right Thumb - Pain    HPI  Review of Systems   Objective: Vital Signs: There were no vitals taken for this visit.  Physical Exam  Ortho Exam  Specialty Comments:  No specialty comments available.  Imaging: No results found.   PMFS History: Patient Active Problem List   Diagnosis Date Noted   Paronychia of right thumb 01/23/2022   Sick sinus syndrome ([place]) 05/24/2013   Hyperlipidemia 08/07/2011   Atrial fibrillation ([place]) 05/11/2011   ESSENTIAL HYPERTENSION, BENIGN 05/09/2010   Coronary atherosclerosis 05/09/2010   ATRIOVENTRICULAR BLOCK, 2ND DEGREE 05/09/2010   Past Medical History:  Diagnosis Date   CAD    s/p CABG   Diabetes mellitus    Dyslipidemia    Essential hypertension, benign    Hematuria    RBBB    Sick  sinus syndrome ([facility])    s/p PPM (MDT) by Dr Verlon Setting 2008    Family History  Problem Relation Age of Onset   Hypertension Mother    Heart disease Father    Heart disease Brother     Past Surgical History:  Procedure Laterality Date   Coronary artery bypass grafting.     lima-lad,svg-OM1-2,svg-rca   Dual-chamber permanent pacemaker implant 2008  06/23/2006   MDT Adapta implanted by Dr Verlon Setting   EP IMPLANTABLE DEVICE N/A 10/15/2015   Procedure:  [place];  Surgeon: Thompson Grayer, MD;  Location: [facility];  Service: Cardiovascular;  Laterality: N/A;   HAND SURGERY  2004   [place]   right   Social History   Occupational History   Occupation: Pharmacist, hospital    Comment: retired  Tobacco Use   Smoking status: Never   Smokeless tobacco: Never  Substance and Sexual Activity   Alcohol use: No   Drug use: No   Sexual activity: Not on file

## 2022-02-19 DIAGNOSIS — S61206A Unspecified open wound of right little finger without damage to nail, initial encounter: Secondary | ICD-10-CM | POA: Diagnosis not present

## 2022-02-24 ENCOUNTER — Ambulatory Visit (INDEPENDENT_AMBULATORY_CARE_PROVIDER_SITE_OTHER): Payer: Medicare PPO

## 2022-02-24 DIAGNOSIS — I495 Sick sinus syndrome: Secondary | ICD-10-CM | POA: Diagnosis not present

## 2022-02-24 LAB — CUP PACEART REMOTE DEVICE CHECK
Battery Impedance: 506 Ohm
Battery Remaining Longevity: 90 mo
Battery Voltage: 2.79 V
Brady Statistic AP VP Percent: 0 %
Brady Statistic AP VS Percent: 8 %
Brady Statistic AS VP Percent: 12 %
Brady Statistic AS VS Percent: 80 %
Date Time Interrogation Session: 20231121083123
Implantable Lead Connection Status: 753985
Implantable Lead Connection Status: 753985
Implantable Lead Implant Date: 20080319
Implantable Lead Implant Date: 20080319
Implantable Lead Location: 753859
Implantable Lead Location: 753860
Implantable Lead Model: 5076
Implantable Lead Model: 5076
Implantable Pulse Generator Implant Date: 20170711
Lead Channel Impedance Value: 437 Ohm
Lead Channel Impedance Value: 623 Ohm
Lead Channel Pacing Threshold Amplitude: 0.75 V
Lead Channel Pacing Threshold Amplitude: 1 V
Lead Channel Pacing Threshold Pulse Width: 0.4 ms
Lead Channel Pacing Threshold Pulse Width: 0.4 ms
Lead Channel Setting Pacing Amplitude: 2 V
Lead Channel Setting Pacing Amplitude: 2.5 V
Lead Channel Setting Pacing Pulse Width: 0.4 ms
Lead Channel Setting Sensing Sensitivity: 2 mV
Zone Setting Status: 755011
Zone Setting Status: 755011

## 2022-03-02 DIAGNOSIS — H402212 Chronic angle-closure glaucoma, right eye, moderate stage: Secondary | ICD-10-CM | POA: Diagnosis not present

## 2022-03-02 DIAGNOSIS — H402221 Chronic angle-closure glaucoma, left eye, mild stage: Secondary | ICD-10-CM | POA: Diagnosis not present

## 2022-03-24 NOTE — Progress Notes (Signed)
Remote pacemaker transmission.   

## 2022-04-16 DIAGNOSIS — H9193 Unspecified hearing loss, bilateral: Secondary | ICD-10-CM | POA: Diagnosis not present

## 2022-04-16 DIAGNOSIS — I2510 Atherosclerotic heart disease of native coronary artery without angina pectoris: Secondary | ICD-10-CM | POA: Diagnosis not present

## 2022-04-16 DIAGNOSIS — Z7901 Long term (current) use of anticoagulants: Secondary | ICD-10-CM | POA: Diagnosis not present

## 2022-04-16 DIAGNOSIS — I10 Essential (primary) hypertension: Secondary | ICD-10-CM | POA: Diagnosis not present

## 2022-04-16 DIAGNOSIS — E785 Hyperlipidemia, unspecified: Secondary | ICD-10-CM | POA: Diagnosis not present

## 2022-04-16 DIAGNOSIS — Z950 Presence of cardiac pacemaker: Secondary | ICD-10-CM | POA: Diagnosis not present

## 2022-04-16 DIAGNOSIS — E1151 Type 2 diabetes mellitus with diabetic peripheral angiopathy without gangrene: Secondary | ICD-10-CM | POA: Diagnosis not present

## 2022-04-16 DIAGNOSIS — I739 Peripheral vascular disease, unspecified: Secondary | ICD-10-CM | POA: Diagnosis not present

## 2022-04-16 DIAGNOSIS — I480 Paroxysmal atrial fibrillation: Secondary | ICD-10-CM | POA: Diagnosis not present

## 2022-05-26 ENCOUNTER — Ambulatory Visit (INDEPENDENT_AMBULATORY_CARE_PROVIDER_SITE_OTHER): Payer: Medicare PPO

## 2022-05-26 DIAGNOSIS — I495 Sick sinus syndrome: Secondary | ICD-10-CM | POA: Diagnosis not present

## 2022-05-29 LAB — CUP PACEART REMOTE DEVICE CHECK
Battery Impedance: 532 Ohm
Battery Remaining Longevity: 87 mo
Battery Voltage: 2.78 V
Brady Statistic AP VP Percent: 0 %
Brady Statistic AP VS Percent: 38 %
Brady Statistic AS VP Percent: 7 %
Brady Statistic AS VS Percent: 54 %
Date Time Interrogation Session: 20240222104048
Implantable Lead Connection Status: 753985
Implantable Lead Connection Status: 753985
Implantable Lead Implant Date: 20080319
Implantable Lead Implant Date: 20080319
Implantable Lead Location: 753859
Implantable Lead Location: 753860
Implantable Lead Model: 5076
Implantable Lead Model: 5076
Implantable Pulse Generator Implant Date: 20170711
Lead Channel Impedance Value: 443 Ohm
Lead Channel Impedance Value: 621 Ohm
Lead Channel Pacing Threshold Amplitude: 0.875 V
Lead Channel Pacing Threshold Amplitude: 1 V
Lead Channel Pacing Threshold Pulse Width: 0.4 ms
Lead Channel Pacing Threshold Pulse Width: 0.4 ms
Lead Channel Setting Pacing Amplitude: 2 V
Lead Channel Setting Pacing Amplitude: 2.5 V
Lead Channel Setting Pacing Pulse Width: 0.4 ms
Lead Channel Setting Sensing Sensitivity: 2 mV
Zone Setting Status: 755011
Zone Setting Status: 755011

## 2022-06-03 ENCOUNTER — Ambulatory Visit: Payer: Medicare PPO | Attending: Physician Assistant | Admitting: Physician Assistant

## 2022-06-03 NOTE — Progress Notes (Deleted)
Cardiology Office Note Date:  06/03/2022  Patient ID:  Akram, Mobbs 1928-09-03, MRN [id number] PCP:  Donnajean Lopes, MD  Cardiologist:  none Electrophysiologist: Dr. Rayann Heman  ***refresh   Chief Complaint: *** 1 year   History of Present Illness: Beni Kubas is a 89 y.o. male with history of CAD (CABG 1996), RBBB, HTN, HLD, SSSx w/PPM, DM, AFlutter  06/12/21 he saw  Dr. Rayann Heman was recommended to add beta blocker due to some ventricular rate elevations and for secondary prevention of coronary artery disease which he declined.  Reported pt preferred rate control as a long term management strategy of his AFlutter  He saw cardiology APP 01/01/22, walking daily for exercise, feeling well. Mentioned pending transfer to Dr. Myles Gip for EP f//u.  No changes were made.  *** symptoms *** burden *** bleeding, eliquis, dose, labs *** labs, lipids *** meds, CAD *** MEALOR *** needs gen cards  Device information MDT dual chamber PPM implanted 06/23/2006, gen change 10/15/2015   Past Medical History:  Diagnosis Date   CAD    s/p CABG   Diabetes mellitus    Dyslipidemia    Essential hypertension, benign    Hematuria    RBBB    Sick sinus syndrome ([facility])    s/p PPM (MDT) by Dr Verlon Setting 2008    Past Surgical History:  Procedure Laterality Date   Coronary artery bypass grafting.     lima-lad,svg-OM1-2,svg-rca   Dual-chamber permanent pacemaker implant 2008  06/23/2006   MDT Adapta implanted by Dr Verlon Setting   EP IMPLANTABLE DEVICE N/A 10/15/2015   Procedure:  [place];  Surgeon: Thompson Grayer, MD;  Location: [facility];  Service: Cardiovascular;  Laterality: N/A;   HAND SURGERY  2004   HERNIA REPAIR  1995   right    Current Outpatient Medications  Medication Sig Dispense Refill   apixaban (ELIQUIS) 5 MG TABS tablet TAKE 1 TABLET TWICE DAILY 180 tablet 1   diltiazem (TIAZAC) 360 MG 24 hr capsule Take 360 mg by mouth daily.       lisinopril-hydrochlorothiazide (PRINZIDE,ZESTORETIC) 20-12.5 MG per tablet Take 1 tablet by mouth daily.      metFORMIN (GLUCOPHAGE-XR) 500 MG 24 hr tablet Take 1,000 mg by mouth daily with supper.      Multiple Vitamin (MULTI-VITAMIN PO) Take 1 tablet by mouth daily.      pravastatin (PRAVACHOL) 40 MG tablet Take 40 mg by mouth at bedtime.      VITAMIN E PO Take 400 Units by mouth daily.      No current facility-administered medications for this visit.    Allergies:   Patient has no known allergies.   Social History:  The patient  reports that he has never smoked. He has never used smokeless tobacco. He reports that he does not drink alcohol and does not use drugs.   Family History:  The patient's family history includes Heart disease in his brother and father; Hypertension in his mother.  ROS:  Please see the history of present illness.    All other systems are reviewed and otherwise negative.   PHYSICAL EXAM:  VS:  There were no vitals taken for this visit. BMI: There is no height or weight on file to calculate BMI. Well nourished, well developed, in no acute distress HEENT: normocephalic, atraumatic Neck: no JVD, carotid bruits or masses Cardiac:  *** RRR; no significant murmurs, no rubs, or gallops Lungs:  *** CTA b/l, no wheezing,  rhonchi or rales Abd: soft, nontender MS: no deformity or *** atrophy Ext: *** no edema Skin: warm and dry, no rash Neuro:  No gross deficits appreciated Psych: euthymic mood, full affect   EKG:  Done today and reviewed by myself shows  ***  Device interrogation done today and reviewed by myself:  ***  Recent Labs: 01/01/2022: Hemoglobin 14.6; Platelets 200  No results found for requested labs within last 365 days.   CrCl cannot be calculated (Patient's most recent lab result is older than the maximum 21 days allowed.).   Wt Readings from Last 3 Encounters:  01/01/22 172 lb (78 kg)  06/12/21 174 lb (78.9 kg)  03/27/21 174 lb 9.6 oz (79.2  kg)     Other studies reviewed: Additional studies/records reviewed today include: summarized above  ASSESSMENT AND PLAN:  PPM ***  AFlutter CHA2DS2Vasc is 5, on Eliquis, *** appropriately dosed *** burden *** rates  CAD ***  HTN ***   Disposition: F/u with ***  Current medicines are reviewed at length with the patient today.  The patient did not have any concerns regarding medicines.  Venetia Night, PA-C 06/03/2022 7:22 AM     [facility] [place]  [id number] [phone] (office)  [phone] (fax)

## 2022-06-26 NOTE — Progress Notes (Signed)
Remote pacemaker transmission.   

## 2022-07-01 DIAGNOSIS — H402212 Chronic angle-closure glaucoma, right eye, moderate stage: Secondary | ICD-10-CM | POA: Diagnosis not present

## 2022-07-01 DIAGNOSIS — H402221 Chronic angle-closure glaucoma, left eye, mild stage: Secondary | ICD-10-CM | POA: Diagnosis not present

## 2022-07-20 DIAGNOSIS — I739 Peripheral vascular disease, unspecified: Secondary | ICD-10-CM | POA: Diagnosis not present

## 2022-07-20 DIAGNOSIS — Z7901 Long term (current) use of anticoagulants: Secondary | ICD-10-CM | POA: Diagnosis not present

## 2022-07-20 DIAGNOSIS — I480 Paroxysmal atrial fibrillation: Secondary | ICD-10-CM | POA: Diagnosis not present

## 2022-07-20 DIAGNOSIS — I10 Essential (primary) hypertension: Secondary | ICD-10-CM | POA: Diagnosis not present

## 2022-07-20 DIAGNOSIS — R351 Nocturia: Secondary | ICD-10-CM | POA: Diagnosis not present

## 2022-07-20 DIAGNOSIS — E1151 Type 2 diabetes mellitus with diabetic peripheral angiopathy without gangrene: Secondary | ICD-10-CM | POA: Diagnosis not present

## 2022-07-20 DIAGNOSIS — E785 Hyperlipidemia, unspecified: Secondary | ICD-10-CM | POA: Diagnosis not present

## 2022-07-20 DIAGNOSIS — C61 Malignant neoplasm of prostate: Secondary | ICD-10-CM | POA: Diagnosis not present

## 2022-08-25 ENCOUNTER — Ambulatory Visit (INDEPENDENT_AMBULATORY_CARE_PROVIDER_SITE_OTHER): Payer: Medicare PPO

## 2022-08-25 DIAGNOSIS — I495 Sick sinus syndrome: Secondary | ICD-10-CM | POA: Diagnosis not present

## 2022-08-28 LAB — CUP PACEART REMOTE DEVICE CHECK
Battery Impedance: 557 Ohm
Battery Remaining Longevity: 85 mo
Battery Voltage: 2.78 V
Brady Statistic AP VP Percent: 1 %
Brady Statistic AP VS Percent: 53 %
Brady Statistic AS VP Percent: 5 %
Brady Statistic AS VS Percent: 41 %
Date Time Interrogation Session: 20240524103024
Implantable Lead Connection Status: 753985
Implantable Lead Connection Status: 753985
Implantable Lead Implant Date: 20080319
Implantable Lead Implant Date: 20080319
Implantable Lead Location: 753859
Implantable Lead Location: 753860
Implantable Lead Model: 5076
Implantable Lead Model: 5076
Implantable Pulse Generator Implant Date: 20170711
Lead Channel Impedance Value: 437 Ohm
Lead Channel Impedance Value: 684 Ohm
Lead Channel Pacing Threshold Amplitude: 0.75 V
Lead Channel Pacing Threshold Amplitude: 1 V
Lead Channel Pacing Threshold Pulse Width: 0.4 ms
Lead Channel Pacing Threshold Pulse Width: 0.4 ms
Lead Channel Setting Pacing Amplitude: 2 V
Lead Channel Setting Pacing Amplitude: 2.5 V
Lead Channel Setting Pacing Pulse Width: 0.4 ms
Lead Channel Setting Sensing Sensitivity: 2 mV
Zone Setting Status: 755011
Zone Setting Status: 755011

## 2022-09-03 DIAGNOSIS — N529 Male erectile dysfunction, unspecified: Secondary | ICD-10-CM | POA: Diagnosis not present

## 2022-09-03 DIAGNOSIS — D6869 Other thrombophilia: Secondary | ICD-10-CM | POA: Diagnosis not present

## 2022-09-03 DIAGNOSIS — R32 Unspecified urinary incontinence: Secondary | ICD-10-CM | POA: Diagnosis not present

## 2022-09-03 DIAGNOSIS — E785 Hyperlipidemia, unspecified: Secondary | ICD-10-CM | POA: Diagnosis not present

## 2022-09-03 DIAGNOSIS — I252 Old myocardial infarction: Secondary | ICD-10-CM | POA: Diagnosis not present

## 2022-09-03 DIAGNOSIS — I4891 Unspecified atrial fibrillation: Secondary | ICD-10-CM | POA: Diagnosis not present

## 2022-09-03 DIAGNOSIS — K219 Gastro-esophageal reflux disease without esophagitis: Secondary | ICD-10-CM | POA: Diagnosis not present

## 2022-09-03 DIAGNOSIS — I10 Essential (primary) hypertension: Secondary | ICD-10-CM | POA: Diagnosis not present

## 2022-09-03 DIAGNOSIS — I2510 Atherosclerotic heart disease of native coronary artery without angina pectoris: Secondary | ICD-10-CM | POA: Diagnosis not present

## 2022-09-18 NOTE — Progress Notes (Signed)
Remote pacemaker transmission.   

## 2022-10-29 DIAGNOSIS — Z950 Presence of cardiac pacemaker: Secondary | ICD-10-CM | POA: Diagnosis not present

## 2022-10-29 DIAGNOSIS — D6869 Other thrombophilia: Secondary | ICD-10-CM | POA: Diagnosis not present

## 2022-10-29 DIAGNOSIS — I739 Peripheral vascular disease, unspecified: Secondary | ICD-10-CM | POA: Diagnosis not present

## 2022-10-29 DIAGNOSIS — I2510 Atherosclerotic heart disease of native coronary artery without angina pectoris: Secondary | ICD-10-CM | POA: Diagnosis not present

## 2022-10-29 DIAGNOSIS — C61 Malignant neoplasm of prostate: Secondary | ICD-10-CM | POA: Diagnosis not present

## 2022-10-29 DIAGNOSIS — E1151 Type 2 diabetes mellitus with diabetic peripheral angiopathy without gangrene: Secondary | ICD-10-CM | POA: Diagnosis not present

## 2022-10-29 DIAGNOSIS — I480 Paroxysmal atrial fibrillation: Secondary | ICD-10-CM | POA: Diagnosis not present

## 2022-11-03 NOTE — Progress Notes (Unsigned)
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   Seen in EP Clinic 06/2021 by Dr. Johney Frame, PPM was functioning normally, she was recommended for beta blocker for elevated V rate on remote histogram but patient declined.   Last remote check 08/28/22 showed no clinically significant events, lead parameters and battery within normal limits.   Since last being seen in our clinic the patient reports doing well.  He remains active, has a "lady friend" that he stays up late with and naps during the day. Denies issues with medications. Asks for schedule for his remote checks.  States he is due for his annual physical with Dr. Jarold Motto at the end of October.   He denies chest pain, palpitations, dyspnea, PND, orthopnea, nausea, vomiting, dizziness, syncope, edema, weight gain, or early satiety.   Review of systems complete and found to be negative unless listed in HPI.      EP Information / Studies Reviewed:    EKG is ordered today. Personal review as below.  EKG Interpretation Date/Time:  Wednesday November 04 2022 09:58:15 EDT Ventricular Rate:  78 PR Interval:  238 QRS Duration:  136 QT Interval:  426 QTC Calculation: 485 R Axis:   81  Text Interpretation: Atrial-paced rhythm with prolonged AV conduction Right bundle branch block When compared with ECG of 23-Jun-2006 16:11, Electronic atrial pacemaker has replaced Sinus rhythm Confirmed by Canary Brim ([id number]) on 11/04/2022 10:11:15 AM   PPM Interrogation-  reviewed in detail today,  See PACEART report. Leads, battery and thresholds within normal limits. 33% AF burden since 2022- most of which  was remote. No EGM's. Marker channels only. Some with 1:1 tachycardia.   Device History: Medtronic Dual Chamber PPM implanted 2008 by Dr. Reyes Ivan for Sinus Node Dysfunction Generator Change: 10/15/15  Risk Assessment/Calculations:    CHA2DS2-VASc Score = 5   This indicates a 7.2% annual risk of stroke. The patient's score is based upon: CHF History: 0 HTN History: 1 Diabetes History: 1 Stroke History: 0 Vascular Disease History: 1 Age Score: 2 Gender Score: 0             Physical Exam:   VS:  BP 132/80   Pulse 78   Ht 5\' 10"  (1.778 m)   Wt 165 lb 12.8 oz (75.2 kg)   SpO2 97%   BMI 23.79 kg/m    Wt Readings from Last 3 Encounters:  11/04/22 165 lb 12.8 oz (75.2 kg)  01/01/22 172 lb (78 kg)  06/12/21 174 lb (78.9 kg)     GEN: Well nourished, well developed in no acute distress NECK: No JVD; No carotid bruits CARDIAC: Regular rate and rhythm, no murmurs, rubs, gallops RESPIRATORY:  Clear to auscultation without rales, wheezing or rhonchi  ABDOMEN: Soft, non-tender, non-distended EXTREMITIES:  No edema or deformity, warm/dry   ASSESSMENT AND PLAN:    Symptomatic bradycardia s/p Medtronic PPM  -Normal PPM function -See Pace Art report -No changes today -schedule for remotes given to patient   Atrial Flutter  Secondary Hypercoagulable State  -continue cardizem  -eliquis 5mg  BID, dose reviewed, appropriate by weight, update labs - CMP, CBC ([facility] reviewed for most recent  labs) -previously wanted to avoid BB  CAD s/p CABG  HLD Apolipoprotein B 76 in 11/2021.  -continue pravastatin -Dr. Jarold Motto manages his cholesterol / HTN medications.    HTN  -well controlled   -on lisinopril-hydrochlorothiazide, diltiazem  Disposition:   Follow up with Dr. Nelly Laurence or EP APP in 12 months  Signed, Canary Brim, MSN, APRN, NP-C, AGACNP-BC [facility] - Electrophysiology  11/04/2022, 11:04 AM

## 2022-11-04 ENCOUNTER — Ambulatory Visit: Payer: Medicare PPO | Admitting: Pulmonary Disease

## 2022-11-04 ENCOUNTER — Encounter: Payer: Self-pay | Admitting: Physician Assistant

## 2022-11-04 VITALS — BP 132/80 | HR 78 | Ht 70.0 in | Wt 165.8 lb

## 2022-11-04 DIAGNOSIS — I25810 Atherosclerosis of coronary artery bypass graft(s) without angina pectoris: Secondary | ICD-10-CM | POA: Diagnosis not present

## 2022-11-04 DIAGNOSIS — I495 Sick sinus syndrome: Secondary | ICD-10-CM

## 2022-11-04 DIAGNOSIS — I4892 Unspecified atrial flutter: Secondary | ICD-10-CM

## 2022-11-04 DIAGNOSIS — D6859 Other primary thrombophilia: Secondary | ICD-10-CM | POA: Diagnosis not present

## 2022-11-04 DIAGNOSIS — I10 Essential (primary) hypertension: Secondary | ICD-10-CM

## 2022-11-04 LAB — CUP PACEART INCLINIC DEVICE CHECK
Battery Impedance: 608 Ohm
Battery Remaining Longevity: 82 mo
Battery Voltage: 2.78 V
Brady Statistic AP VP Percent: 1 %
Brady Statistic AP VS Percent: 60 %
Brady Statistic AS VP Percent: 5 %
Brady Statistic AS VS Percent: 35 %
Date Time Interrogation Session: 20240731120111
Implantable Lead Connection Status: 753985
Implantable Lead Connection Status: 753985
Implantable Lead Implant Date: 20080319
Implantable Lead Implant Date: 20080319
Implantable Lead Location: 753859
Implantable Lead Location: 753860
Implantable Lead Model: 5076
Implantable Lead Model: 5076
Implantable Pulse Generator Implant Date: 20170711
Lead Channel Impedance Value: 455 Ohm
Lead Channel Impedance Value: 665 Ohm
Lead Channel Pacing Threshold Amplitude: 0.75 V
Lead Channel Pacing Threshold Amplitude: 0.75 V
Lead Channel Pacing Threshold Amplitude: 0.75 V
Lead Channel Pacing Threshold Amplitude: 0.875 V
Lead Channel Pacing Threshold Pulse Width: 0.4 ms
Lead Channel Pacing Threshold Pulse Width: 0.4 ms
Lead Channel Pacing Threshold Pulse Width: 0.4 ms
Lead Channel Pacing Threshold Pulse Width: 0.4 ms
Lead Channel Sensing Intrinsic Amplitude: 2.8 mV
Lead Channel Sensing Intrinsic Amplitude: 5.6 mV
Lead Channel Setting Pacing Amplitude: 2 V
Lead Channel Setting Pacing Amplitude: 2.5 V
Lead Channel Setting Pacing Pulse Width: 0.4 ms
Lead Channel Setting Sensing Sensitivity: 2 mV
Zone Setting Status: 755011
Zone Setting Status: 755011

## 2022-11-04 NOTE — Patient Instructions (Addendum)
Medication Instructions:  Your physician recommends that you continue on your current medications as directed. Please refer to the Current Medication list given to you today. *If you need a refill on your cardiac medications before your next appointment, please call your pharmacy*   Lab Work: TODAY-CMET & CBC If you have labs (blood work) drawn today and your tests are completely normal, you will receive your results only by: MyChart Message (if you have MyChart) OR A paper copy in the mail If you have any lab test that is abnormal or we need to change your treatment, we will call you to review the results.   Testing/Procedures: NONE ORDERED   Follow-Up: At [facility], you and your health needs are our priority.  As part of our continuing mission to provide you with exceptional heart care, we have created designated Provider Care Teams.  These Care Teams include your primary Cardiologist (physician) and Advanced Practice Providers (APPs -  Physician Assistants and Nurse Practitioners) who all work together to provide you with the care you need, when you need it.  We recommend signing up for the patient portal called "MyChart".  Sign up information is provided on this After Visit Summary.  MyChart is used to connect with patients for Virtual Visits (Telemedicine).  Patients are able to view lab/test results, encounter notes, upcoming appointments, etc.  Non-urgent messages can be sent to your provider as well.   To learn more about what you can do with MyChart, go to [email or web address].    Your next appointment:   12 month(s)  Provider:   You may see Maurice Small, MD or one of the following Advanced Practice Providers on your designated Care Team:   Francis Dowse, [place] [address]" [place], [place] Sherie Don, NP Canary Brim, NP    Other Instructions

## 2022-11-11 DIAGNOSIS — L821 Other seborrheic keratosis: Secondary | ICD-10-CM | POA: Diagnosis not present

## 2022-11-11 DIAGNOSIS — L570 Actinic keratosis: Secondary | ICD-10-CM | POA: Diagnosis not present

## 2022-11-11 DIAGNOSIS — L820 Inflamed seborrheic keratosis: Secondary | ICD-10-CM | POA: Diagnosis not present

## 2022-11-11 DIAGNOSIS — Z85828 Personal history of other malignant neoplasm of skin: Secondary | ICD-10-CM | POA: Diagnosis not present

## 2022-11-11 DIAGNOSIS — D1801 Hemangioma of skin and subcutaneous tissue: Secondary | ICD-10-CM | POA: Diagnosis not present

## 2022-11-11 DIAGNOSIS — D2372 Other benign neoplasm of skin of left lower limb, including hip: Secondary | ICD-10-CM | POA: Diagnosis not present

## 2022-11-11 DIAGNOSIS — L812 Freckles: Secondary | ICD-10-CM | POA: Diagnosis not present

## 2022-11-11 DIAGNOSIS — L720 Epidermal cyst: Secondary | ICD-10-CM | POA: Diagnosis not present

## 2022-11-24 ENCOUNTER — Ambulatory Visit (INDEPENDENT_AMBULATORY_CARE_PROVIDER_SITE_OTHER): Payer: Medicare PPO

## 2022-11-24 DIAGNOSIS — I495 Sick sinus syndrome: Secondary | ICD-10-CM

## 2022-11-25 LAB — CUP PACEART REMOTE DEVICE CHECK
Battery Impedance: 608 Ohm
Battery Remaining Longevity: 80 mo
Battery Voltage: 2.78 V
Brady Statistic AP VP Percent: 0 %
Brady Statistic AP VS Percent: 97 %
Brady Statistic AS VP Percent: 0 %
Brady Statistic AS VS Percent: 3 %
Date Time Interrogation Session: 20240820082410
Implantable Lead Connection Status: 753985
Implantable Lead Connection Status: 753985
Implantable Lead Implant Date: 20080319
Implantable Lead Implant Date: 20080319
Implantable Lead Location: 753859
Implantable Lead Location: 753860
Implantable Lead Model: 5076
Implantable Lead Model: 5076
Implantable Pulse Generator Implant Date: 20170711
Lead Channel Impedance Value: 437 Ohm
Lead Channel Impedance Value: 700 Ohm
Lead Channel Pacing Threshold Amplitude: 0.75 V
Lead Channel Pacing Threshold Amplitude: 0.875 V
Lead Channel Pacing Threshold Pulse Width: 0.4 ms
Lead Channel Pacing Threshold Pulse Width: 0.4 ms
Lead Channel Setting Pacing Amplitude: 2 V
Lead Channel Setting Pacing Amplitude: 2.5 V
Lead Channel Setting Pacing Pulse Width: 0.4 ms
Lead Channel Setting Sensing Sensitivity: 2 mV
Zone Setting Status: 755011
Zone Setting Status: 755011

## 2022-12-04 NOTE — Progress Notes (Signed)
Remote pacemaker transmission.   

## 2023-01-07 DIAGNOSIS — E119 Type 2 diabetes mellitus without complications: Secondary | ICD-10-CM | POA: Diagnosis not present

## 2023-01-07 DIAGNOSIS — H402212 Chronic angle-closure glaucoma, right eye, moderate stage: Secondary | ICD-10-CM | POA: Diagnosis not present

## 2023-01-07 DIAGNOSIS — H04123 Dry eye syndrome of bilateral lacrimal glands: Secondary | ICD-10-CM | POA: Diagnosis not present

## 2023-01-07 DIAGNOSIS — H402221 Chronic angle-closure glaucoma, left eye, mild stage: Secondary | ICD-10-CM | POA: Diagnosis not present

## 2023-02-02 ENCOUNTER — Encounter: Payer: Self-pay | Admitting: Physician Assistant

## 2023-02-02 DIAGNOSIS — Z1389 Encounter for screening for other disorder: Secondary | ICD-10-CM | POA: Diagnosis not present

## 2023-02-02 DIAGNOSIS — E1151 Type 2 diabetes mellitus with diabetic peripheral angiopathy without gangrene: Secondary | ICD-10-CM | POA: Diagnosis not present

## 2023-02-02 DIAGNOSIS — Z125 Encounter for screening for malignant neoplasm of prostate: Secondary | ICD-10-CM | POA: Diagnosis not present

## 2023-02-02 NOTE — Progress Notes (Unsigned)
Cardiology Office Note:    Date:  02/03/2023  ID:  Dewitt Rota, DOB May 27, 1928, MRN [id number] PCP: Garlan Fillers, MD  [facility] Providers Cardiologist:  Peter Swaziland, MD Cardiology APP:  Alver Sorrow, NP  Electrophysiologist:  Maurice Small, MD       Patient Profile:      Coronary artery disease  S/p CABG in 1996 (L-LAD, S-OM1/OM2, S-PDA) Right Bundle Branch Block  Paroxysmal atrial fibrillation  Sick sinus syndrome s/p Pacemaker  Diabetes mellitus  Hypertension  Hyperlipidemia          History of Present Illness:  Discussed the use of AI scribe software for clinical note transcription with the patient, who gave verbal consent to proceed.  Peter Reynolds is a 89 y.o. male who returns for follow up of CAD. He was last seen by Gillian Shields, NP in 12/2021.  He is here alone.  He reports no recent chest pain or angina symptoms, even during regular exercise.  He has not had shortness of breath, orthopnea, leg edema, syncope.     Review of Systems  Gastrointestinal:  Negative for hematochezia and melena.  Genitourinary:  Negative for hematuria.  See HPI     Studies Reviewed:       Results   LABS-[facility] DXA Triglycerides: 234 (02/02/2023) LDL: 59 (02/02/2023) Apo B: 74 (02/02/2023) A1c: 8.7 (02/02/2023) Hemoglobin: 14.3 (02/02/2023) Creatinine: 1.02 (02/02/2023) Potassium: 4.6 (02/02/2023) ALT: 30 (02/02/2023)      Risk Assessment/Calculations:    CHA2DS2-VASc Score = 5   This indicates a 7.2% annual risk of stroke. The patient's score is based upon: CHF History: 0 HTN History: 1 Diabetes History: 1 Stroke History: 0 Vascular Disease History: 1 Age Score: 2 Gender Score: 0            Physical Exam:   VS:  BP 136/60   Pulse 84   Ht 5\' 10"  (1.778 m)   Wt 167 lb (75.8 kg)   SpO2 98%   BMI 23.96 kg/m    Wt Readings from Last 3 Encounters:  02/03/23 167 lb (75.8 kg)  11/04/22 165 lb 12.8 oz (75.2  kg)  01/01/22 172 lb (78 kg)    Constitutional:      Appearance: Healthy appearance. Not in distress.  Neck:     Vascular: No carotid bruit. JVD normal.  Pulmonary:     Breath sounds: Normal breath sounds. No wheezing. No rales.  Cardiovascular:     Normal rate. Regular rhythm.     Murmurs: There is no murmur.  Edema:    Peripheral edema absent.  Abdominal:     Palpations: Abdomen is soft.        Assessment and Plan:   Assessment & Plan Coronary artery disease involving native coronary artery of native heart without angina pectoris S/p CABG in 1996. No current anginal symptoms.  He regularly exercises without chest pain. -Continue Nitroglycerin PRN for chest pain, Pravastatin 40mg  daily. -Follow-up 1 year Paroxysmal atrial fibrillation (HCC) Tolerating anticoagulation well with recent normal hemoglobin and creatinine. -Continue Eliquis 5mg  twice daily, diltiazem 360 mg daily. Essential hypertension Blood pressure controlled. -Continue Cardizem CD 360mg  daily. -Continue Lisinopril/HCTZ 20/12.5mg  daily. Hyperlipidemia LDL goal <70 Recent labs from primary care reviewed.  Triglycerides somewhat elevated, LDL optimal. -Continue Pravastatin 40mg  daily. -Continue to work on diet to reduce triglycerides. Type 2 diabetes mellitus without complication, without long-term current use of insulin (HCC) Recent A1c 8.7.  He remains on metformin.  He  has follow-up with primary care next week.  Consider SGLT2 inhibitor. Sick sinus syndrome (HCC) Status post pacemaker.  Follow-up with EP as planned.       Dispo:  Return in about 1 year (around 02/03/2024) for Routine Follow Up w/ Dr. Swaziland.  Signed, Tereso Newcomer, PA-C

## 2023-02-03 ENCOUNTER — Ambulatory Visit: Payer: Medicare PPO | Attending: Physician Assistant | Admitting: Physician Assistant

## 2023-02-03 ENCOUNTER — Encounter: Payer: Self-pay | Admitting: Physician Assistant

## 2023-02-03 VITALS — BP 136/60 | HR 84 | Ht 70.0 in | Wt 167.0 lb

## 2023-02-03 DIAGNOSIS — E785 Hyperlipidemia, unspecified: Secondary | ICD-10-CM

## 2023-02-03 DIAGNOSIS — E119 Type 2 diabetes mellitus without complications: Secondary | ICD-10-CM

## 2023-02-03 DIAGNOSIS — I495 Sick sinus syndrome: Secondary | ICD-10-CM

## 2023-02-03 DIAGNOSIS — I10 Essential (primary) hypertension: Secondary | ICD-10-CM

## 2023-02-03 DIAGNOSIS — I2510 Atherosclerotic heart disease of native coronary artery without angina pectoris: Secondary | ICD-10-CM | POA: Diagnosis not present

## 2023-02-03 DIAGNOSIS — I25810 Atherosclerosis of coronary artery bypass graft(s) without angina pectoris: Secondary | ICD-10-CM

## 2023-02-03 DIAGNOSIS — I480 Paroxysmal atrial fibrillation: Secondary | ICD-10-CM

## 2023-02-03 NOTE — Patient Instructions (Signed)
Medication Instructions:  Your physician recommends that you continue on your current medications as directed. Please refer to the Current Medication list given to you today.  *If you need a refill on your cardiac medications before your next appointment, please call your pharmacy*   Lab Work: None ordered  If you have labs (blood work) drawn today and your tests are completely normal, you will receive your results only by: MyChart Message (if you have MyChart) OR A paper copy in the mail If you have any lab test that is abnormal or we need to change your treatment, we will call you to review the results.   Testing/Procedures: None ordered   Follow-Up: At [facility], you and your health needs are our priority.  As part of our continuing mission to provide you with exceptional heart care, we have created designated Provider Care Teams.  These Care Teams include your primary Cardiologist (physician) and Advanced Practice Providers (APPs -  Physician Assistants and Nurse Practitioners) who all work together to provide you with the care you need, when you need it.  We recommend signing up for the patient portal called "MyChart".  Sign up information is provided on this After Visit Summary.  MyChart is used to connect with patients for Virtual Visits (Telemedicine).  Patients are able to view lab/test results, encounter notes, upcoming appointments, etc.  Non-urgent messages can be sent to your provider as well.   To learn more about what you can do with MyChart, go to [email or web address].    Your next appointment:   12 month(s)  Provider:   Peter Swaziland, MD     Other Instructions

## 2023-02-03 NOTE — Assessment & Plan Note (Signed)
Recent labs from primary care reviewed.  Triglycerides somewhat elevated, LDL optimal. -Continue Pravastatin 40mg  daily. -Continue to work on diet to reduce triglycerides.

## 2023-02-03 NOTE — Assessment & Plan Note (Signed)
Blood pressure controlled. -Continue Cardizem CD 360mg  daily. -Continue Lisinopril/HCTZ 20/12.5mg  daily.

## 2023-02-03 NOTE — Assessment & Plan Note (Signed)
Tolerating anticoagulation well with recent normal hemoglobin and creatinine. -Continue Eliquis 5mg  twice daily, diltiazem 360 mg daily.

## 2023-02-03 NOTE — Assessment & Plan Note (Signed)
S/p CABG in 1996. No current anginal symptoms.  He regularly exercises without chest pain. -Continue Nitroglycerin PRN for chest pain, Pravastatin 40mg  daily. -Follow-up 1 year

## 2023-02-03 NOTE — Assessment & Plan Note (Signed)
Recent A1c 8.7.  He remains on metformin.  He has follow-up with primary care next week.  Consider SGLT2 inhibitor.

## 2023-02-03 NOTE — Assessment & Plan Note (Signed)
Status post pacemaker.  Follow-up with EP as planned. ?

## 2023-02-09 DIAGNOSIS — D6869 Other thrombophilia: Secondary | ICD-10-CM | POA: Diagnosis not present

## 2023-02-09 DIAGNOSIS — E1151 Type 2 diabetes mellitus with diabetic peripheral angiopathy without gangrene: Secondary | ICD-10-CM | POA: Diagnosis not present

## 2023-02-09 DIAGNOSIS — I739 Peripheral vascular disease, unspecified: Secondary | ICD-10-CM | POA: Diagnosis not present

## 2023-02-09 DIAGNOSIS — I2510 Atherosclerotic heart disease of native coronary artery without angina pectoris: Secondary | ICD-10-CM | POA: Diagnosis not present

## 2023-02-09 DIAGNOSIS — I480 Paroxysmal atrial fibrillation: Secondary | ICD-10-CM | POA: Diagnosis not present

## 2023-02-09 DIAGNOSIS — R82998 Other abnormal findings in urine: Secondary | ICD-10-CM | POA: Diagnosis not present

## 2023-02-09 DIAGNOSIS — C61 Malignant neoplasm of prostate: Secondary | ICD-10-CM | POA: Diagnosis not present

## 2023-02-09 DIAGNOSIS — Z950 Presence of cardiac pacemaker: Secondary | ICD-10-CM | POA: Diagnosis not present

## 2023-02-09 DIAGNOSIS — Z0000 Encounter for general adult medical examination without abnormal findings: Secondary | ICD-10-CM | POA: Diagnosis not present

## 2023-02-09 DIAGNOSIS — I10 Essential (primary) hypertension: Secondary | ICD-10-CM | POA: Diagnosis not present

## 2023-02-09 DIAGNOSIS — Z1339 Encounter for screening examination for other mental health and behavioral disorders: Secondary | ICD-10-CM | POA: Diagnosis not present

## 2023-02-09 DIAGNOSIS — Z1331 Encounter for screening for depression: Secondary | ICD-10-CM | POA: Diagnosis not present

## 2023-02-23 ENCOUNTER — Ambulatory Visit (INDEPENDENT_AMBULATORY_CARE_PROVIDER_SITE_OTHER): Payer: Medicare PPO

## 2023-02-23 DIAGNOSIS — I495 Sick sinus syndrome: Secondary | ICD-10-CM | POA: Diagnosis not present

## 2023-02-23 LAB — CUP PACEART REMOTE DEVICE CHECK
Battery Impedance: 659 Ohm
Battery Remaining Longevity: 78 mo
Battery Voltage: 2.78 V
Brady Statistic AP VP Percent: 0 %
Brady Statistic AP VS Percent: 95 %
Brady Statistic AS VP Percent: 0 %
Brady Statistic AS VS Percent: 4 %
Date Time Interrogation Session: 20241119103958
Implantable Lead Connection Status: 753985
Implantable Lead Connection Status: 753985
Implantable Lead Implant Date: 20080319
Implantable Lead Implant Date: 20080319
Implantable Lead Location: 753859
Implantable Lead Location: 753860
Implantable Lead Model: 5076
Implantable Lead Model: 5076
Implantable Pulse Generator Implant Date: 20170711
Lead Channel Impedance Value: 461 Ohm
Lead Channel Impedance Value: 717 Ohm
Lead Channel Pacing Threshold Amplitude: 0.75 V
Lead Channel Pacing Threshold Amplitude: 0.875 V
Lead Channel Pacing Threshold Pulse Width: 0.4 ms
Lead Channel Pacing Threshold Pulse Width: 0.4 ms
Lead Channel Setting Pacing Amplitude: 2 V
Lead Channel Setting Pacing Amplitude: 2.5 V
Lead Channel Setting Pacing Pulse Width: 0.4 ms
Lead Channel Setting Sensing Sensitivity: 2 mV
Zone Setting Status: 755011
Zone Setting Status: 755011

## 2023-03-08 DIAGNOSIS — N3941 Urge incontinence: Secondary | ICD-10-CM | POA: Diagnosis not present

## 2023-03-08 DIAGNOSIS — N403 Nodular prostate with lower urinary tract symptoms: Secondary | ICD-10-CM | POA: Diagnosis not present

## 2023-03-08 DIAGNOSIS — C61 Malignant neoplasm of prostate: Secondary | ICD-10-CM | POA: Diagnosis not present

## 2023-03-08 DIAGNOSIS — R351 Nocturia: Secondary | ICD-10-CM | POA: Diagnosis not present

## 2023-03-22 NOTE — Progress Notes (Signed)
Remote pacemaker transmission.   

## 2023-04-22 DIAGNOSIS — N3941 Urge incontinence: Secondary | ICD-10-CM | POA: Diagnosis not present

## 2023-04-27 DIAGNOSIS — I2510 Atherosclerotic heart disease of native coronary artery without angina pectoris: Secondary | ICD-10-CM | POA: Diagnosis not present

## 2023-04-27 DIAGNOSIS — E1151 Type 2 diabetes mellitus with diabetic peripheral angiopathy without gangrene: Secondary | ICD-10-CM | POA: Diagnosis not present

## 2023-04-27 DIAGNOSIS — I10 Essential (primary) hypertension: Secondary | ICD-10-CM | POA: Diagnosis not present

## 2023-04-28 ENCOUNTER — Encounter: Payer: Self-pay | Admitting: Cardiovascular Disease

## 2023-04-28 ENCOUNTER — Telehealth (HOSPITAL_BASED_OUTPATIENT_CLINIC_OR_DEPARTMENT_OTHER): Payer: Self-pay | Admitting: *Deleted

## 2023-04-28 DIAGNOSIS — K4090 Unilateral inguinal hernia, without obstruction or gangrene, not specified as recurrent: Secondary | ICD-10-CM | POA: Diagnosis not present

## 2023-04-28 NOTE — Telephone Encounter (Signed)
Pharmacy please advise on holding Eliquis prior to hernia surgery scheduled for TBD. Thank you.

## 2023-04-28 NOTE — Progress Notes (Signed)
PERIOPERATIVE PRESCRIPTION FOR IMPLANTED CARDIAC DEVICE PROGRAMMING   Patient Information:  Patient: Peter Reynolds  MRN: [id number]  Date of Birth: 05-23-1928     Surgeon:  Dr. Ivar Drape Surgeon's Group or Practice Name:  [facility] Phone number:  [phone] Fax number:  [phone] Planned Procedure:  Hernia Surgery  Date of Procedure:  TBD    Device Information:   Clinic EP Physician:   Dr. York Pellant Device Type:  Pacemaker Manufacturer and Phone #:  Medtronic: [phone] Pacemaker Dependent?:  Unknown Date of Last Device Check:  02/23/2023        Normal Device Function?:  Yes     Electrophysiologist's Recommendations:   Have magnet available. Provide continuous ECG monitoring when magnet is used or reprogramming is to be performed.  Procedure may interfere with device function.  Magnet should be placed over device during procedure.  Per Device Clinic Standing Jarrett Ables, Lenor Coffin  04/28/2023 1:38 PM

## 2023-04-28 NOTE — Telephone Encounter (Signed)
   Pre-operative Risk Assessment    Patient Name: Peter Reynolds  DOB: 1929/02/02 MRN: [id number]   Date of last office visit: 02/03/2023 Date of next office visit: None   Request for Surgical Clearance    Procedure:   Hernia Surgery  Date of Surgery:  Clearance TBD                                 Surgeon:  Dr. Ivar Drape Surgeon's Group or Practice Name:  [facility] Phone number:  [phone] Fax number:  [phone]   Type of Clearance Requested:   - Medical  - Pharmacy:  Hold Apixaban (Eliquis) Not Indicated   Type of Anesthesia:  General    Additional requests/questions:    Signed, Emmit Pomfret   04/28/2023, 1:25 PM

## 2023-04-29 ENCOUNTER — Telehealth: Payer: Self-pay | Admitting: *Deleted

## 2023-04-29 NOTE — Telephone Encounter (Signed)
Pt has been scheduled tele preop appt 05/12/23. Med rec and consent are done.     Patient Consent for Virtual Visit        Peter Reynolds has provided verbal consent on 04/29/2023 for a virtual visit (video or telephone).   CONSENT FOR VIRTUAL VISIT FOR:  Peter Reynolds  By participating in this virtual visit I agree to the following:  I hereby voluntarily request, consent and authorize [facility] and its employed or contracted physicians, physician assistants, nurse practitioners or other licensed health care professionals (the Practitioner), to provide me with telemedicine health care services (the "Services") as deemed necessary by the treating Practitioner. I acknowledge and consent to receive the Services by the Practitioner via telemedicine. I understand that the telemedicine visit will involve communicating with the Practitioner through live audiovisual communication technology and the disclosure of certain medical information by electronic transmission. I acknowledge that I have been given the opportunity to request an in-person assessment or other available alternative prior to the telemedicine visit and am voluntarily participating in the telemedicine visit.  I understand that I have the right to withhold or withdraw my consent to the use of telemedicine in the course of my care at any time, without affecting my right to future care or treatment, and that the Practitioner or I may terminate the telemedicine visit at any time. I understand that I have the right to inspect all information obtained and/or recorded in the course of the telemedicine visit and may receive copies of available information for a reasonable fee.  I understand that some of the potential risks of receiving the Services via telemedicine include:  Delay or interruption in medical evaluation due to technological equipment failure or disruption; Information transmitted may not be sufficient  (e.g. poor resolution of images) to allow for appropriate medical decision making by the Practitioner; and/or  In rare instances, security protocols could fail, causing a breach of personal health information.  Furthermore, I acknowledge that it is my responsibility to provide information about my medical history, conditions and care that is complete and accurate to the best of my ability. I acknowledge that Practitioner's advice, recommendations, and/or decision may be based on factors not within their control, such as incomplete or inaccurate data provided by me or distortions of diagnostic images or specimens that may result from electronic transmissions. I understand that the practice of medicine is not an exact science and that Practitioner makes no warranties or guarantees regarding treatment outcomes. I acknowledge that a copy of this consent can be made available to me via my patient portal [facility] MyChart), or I can request a printed copy by calling the office of [facility].    I understand that my insurance will be billed for this visit.   I have read or had this consent read to me. I understand the contents of this consent, which adequately explains the benefits and risks of the Services being provided via telemedicine.  I have been provided ample opportunity to ask questions regarding this consent and the Services and have had my questions answered to my satisfaction. I give my informed consent for the services to be provided through the use of telemedicine in my medical care

## 2023-04-29 NOTE — Telephone Encounter (Signed)
   Name: Peter Reynolds  DOB: 1928-12-16  MRN: [id number]  Primary Cardiologist: Peter Swaziland, MD   Preoperative team, please contact this patient and set up a phone call appointment for further preoperative risk assessment. Please obtain consent and complete medication review. Thank you for your help.  I confirm that guidance regarding antiplatelet and oral anticoagulation therapy has been completed and, if necessary, noted below.  Per office protocol, patient can hold Eliquis for 2 days prior to procedure.    I also confirmed the patient resides in the state of [place]. As per [facility] Medical Board telemedicine laws, the patient must reside in the state in which the provider is licensed.   Napoleon Form, Leodis Rains, NP 04/29/2023, 8:42 AM [facility]

## 2023-04-29 NOTE — Telephone Encounter (Signed)
Pt has been scheduled tele pre op appt 05/12/23. Med rec and consent are done.

## 2023-04-29 NOTE — Telephone Encounter (Signed)
Patient with diagnosis of afib on Eliquis for anticoagulation.    Procedure: Hernia Surgery  Date of procedure: TBD   CHA2DS2-VASc Score = 5   This indicates a 7.2% annual risk of stroke. The patient's score is based upon: CHF History: 0 HTN History: 1 Diabetes History: 1 Stroke History: 0 Vascular Disease History: 1 Age Score: 2 Gender Score: 0      CrCl 52 ml/min Platelet count 191  Per office protocol, patient can hold Eliquis for 2 days prior to procedure.    **This guidance is not considered finalized until pre-operative APP has relayed final recommendations.**

## 2023-05-12 ENCOUNTER — Ambulatory Visit: Payer: Medicare PPO | Attending: Cardiology | Admitting: Cardiology

## 2023-05-12 DIAGNOSIS — Z01810 Encounter for preprocedural cardiovascular examination: Secondary | ICD-10-CM

## 2023-05-12 NOTE — Progress Notes (Signed)
 Virtual Visit via Telephone Note   Because of Peter Reynolds's co-morbid illnesses, he is at least at moderate risk for complications without adequate follow up.  This format is felt to be most appropriate for this patient at this time.  The patient did not have access to video technology/had technical difficulties with video requiring transitioning to audio format only (telephone).  All issues noted in this document were discussed and addressed.  No physical exam could be performed with this format.  Please refer to the patient's chart for his consent to telehealth for [facility].  Evaluation Performed:  Preoperative cardiovascular risk assessment _____________   Date:  05/12/2023   Patient ID:  Peter Reynolds, DOB 1929/02/23, MRN [id number] Patient Location:  Home Provider location:   Office  Primary Care Provider:  Yolande Toribio MATSU, MD Primary Cardiologist:  Peter Jordan, MD  Chief Complaint / Patient Profile   89 y.o. y/o male with a h/o coronary artery disease s/p CABG in 1996, right bundle branch block, paroxysmal atrial fibrillation, sick sinus syndrome s/p pacemaker, diabetes mellitus, hypertension, hyperlipidemia who is pending hernia surgery with Dr. Lyndel  and presents today for telephonic preoperative cardiovascular risk assessment.  History of Present Illness    Peter Reynolds is a 89 y.o. male who presents via audio/video conferencing for a telehealth visit today.  Pt was last seen in cardiology clinic on 02/03/23 by Glendia Ferrier, PA.  At that time Rolondo Pierre was doing well.  The patient is now pending procedure as outlined above. Since his last visit, he has remained stable from a cardiac standpoint. Patient reports that he walks a mile every morning when he first gets up in the morning, he denies chest pain or shortness of breath with exercise.   Today he denies chest pain, shortness of breath, lower  extremity edema, fatigue, palpitations, melena, hematuria, hemoptysis, diaphoresis, weakness, presyncope, syncope, orthopnea, and PND.   Past Medical History    Past Medical History:  Diagnosis Date   CAD    s/p CABG   Diabetes mellitus    Dyslipidemia    Essential hypertension, benign    Hematuria    RBBB    Sick sinus syndrome (HCC)    s/p PPM (MDT) by Dr Ellin 2008   Past Surgical History:  Procedure Laterality Date   Coronary artery bypass grafting.     lima-lad,svg-OM1-2,svg-rca   Dual-chamber permanent pacemaker implant 2008  06/23/2006   MDT Adapta implanted by Dr Ellin   EP IMPLANTABLE DEVICE N/A 10/15/2015   Procedure:  PPM Generator Changeout;  Surgeon: Lynwood Rakers, MD;  Location: [facility] INVASIVE CV LAB;  Service: Cardiovascular;  Laterality: N/A;   HAND SURGERY  2004   HERNIA REPAIR  1995   right   Allergies No Known Allergies Home Medications    Prior to Admission medications   Medication Sig Start Date End Date Taking? Authorizing Provider  apixaban  (ELIQUIS ) 5 MG TABS tablet TAKE 1 TABLET TWICE DAILY 12/19/21   Allred, Lynwood, MD  Cholecalciferol (VITAMIN D3 PO) Take 1 tablet by mouth daily at 6 (six) AM. Multi-Vitamin plus zinc    [provider]  diltiazem  (TIAZAC ) 360 MG 24 hr capsule Take 360 mg by mouth daily.  04/24/15   [provider]  dorzolamide-timolol (COSOPT) 2-0.5 % ophthalmic solution Place 1 drop into both eyes 2 (two) times daily. 10/25/22   [provider]  lisinopril-hydrochlorothiazide (PRINZIDE,ZESTORETIC) 20-12.5 MG per tablet Take 1 tablet by mouth  daily.  04/29/11   [provider]  metFORMIN (GLUCOPHAGE-XR) 500 MG 24 hr tablet Take 1,000 mg by mouth daily with supper.  03/11/11   [provider]  Multiple Vitamin (MULTI-VITAMIN PO) Take 1 tablet by mouth daily.     [provider]  nitroGLYCERIN  (NITROSTAT ) 0.4 MG SL tablet Place 0.4 mg under the tongue every 5 (five) minutes as needed for  chest pain. 10/29/22   [provider]  pravastatin (PRAVACHOL) 40 MG tablet Take 40 mg by mouth at bedtime.  02/19/11   [provider]  silodosin (RAPAFLO) 4 MG CAPS capsule Take 4 mg by mouth daily. 03/09/23   [provider]  VITAMIN E PO Take 400 Units by mouth daily.     [provider]    Physical Exam    Vital Signs:  Oddis Westling does not have vital signs available for review today.  Given telephonic nature of communication, physical exam is limited. AAOx3. NAD. Normal affect.  Speech and respirations are unlabored.  Accessory Clinical Findings   None Assessment & Plan    1.  Preoperative Cardiovascular Risk Assessment: Hernia surgery with Dr. Lyndel   Mr. Manthe's perioperative risk of a major cardiac event is 0.9% according to the Revised Cardiac Risk Index (RCRI).  Therefore, he is at low risk for perioperative complications.  His functional capacity is fair at 5.07 METs according to the Duke Activity Status Index (DASI). Recommendations: According to ACC/AHA guidelines, no further cardiovascular testing needed.  The patient may proceed to surgery at acceptable risk.   Antiplatelet and/or Anticoagulation Recommendations: Eliquis  (Apixaban ) can be held for 2 days prior to surgery.  Please resume post op when felt to be safe.     The patient was advised that if he develops new symptoms prior to surgery to contact our office to arrange for a follow-up visit, and he verbalized understanding.  A copy of this note will be routed to requesting surgeon.  Time:   Today, I have spent 12 minutes with the patient with telehealth technology discussing medical history, symptoms, and management plan.    Chrisanne Loose D Willadeen Colantuono, NP  05/12/2023, 5:09 PM

## 2023-05-19 ENCOUNTER — Ambulatory Visit: Payer: Self-pay | Admitting: Surgery

## 2023-05-19 NOTE — Progress Notes (Signed)
Sent message, via epic in basket, requesting orders in epic from Careers adviser.

## 2023-05-24 NOTE — Patient Instructions (Signed)
DUE TO COVID-19 ONLY TWO VISITORS  (aged 88 and older)  ARE ALLOWED TO COME WITH YOU AND STAY IN THE WAITING ROOM ONLY DURING PRE OP AND PROCEDURE.   **NO VISITORS ARE ALLOWED IN THE SHORT STAY AREA OR RECOVERY ROOM!!**  IF YOU WILL BE ADMITTED INTO THE HOSPITAL YOU ARE ALLOWED ONLY FOUR SUPPORT PEOPLE DURING VISITATION HOURS ONLY (7 AM -8PM)   The support person(s) must pass our screening, gel in and out, and wear a mask at all times, including in the patient's room. Patients must also wear a mask when staff or their support person are in the room. Visitors GUEST BADGE MUST BE WORN VISIBLY  One adult visitor may remain with you overnight and MUST be in the room by 8 P.M.     Your procedure is scheduled on: 06/08/23   Report to [facility] Main Entrance    Report to admitting at : 1:15 PM   Call this number if you have problems the morning of surgery [phone]   Eat a light diet the day before surgery.  Examples including soups, broths, toast, yogurt, mashed potatoes.  Things to avoid include carbonated beverages (fizzy beverages), raw fruits and raw vegetables, or beans.   If your bowels are filled with gas, your surgeon will have difficulty visualizing your pelvic organs which increases your surgical risks.  Do not eat food :After Midnight.   After Midnight you may have the following liquids until : 12:30 PM DAY OF SURGERY  Water Black Coffee (sugar ok, NO MILK/CREAM OR CREAMERS)  Tea (sugar ok, NO MILK/CREAM OR CREAMERS) regular and decaf                             Plain Jell-O (NO RED)                                           Fruit ices (not with fruit pulp, NO RED)                                     Popsicles (NO RED)                                                                  Juice: apple, WHITE grape, WHITE cranberry Sports drinks like Gatorade (NO RED)              FOLLOW ANY ADDITIONAL PRE OP INSTRUCTIONS YOU RECEIVED FROM YOUR SURGEON'S OFFICE!!!    Oral Hygiene is also important to reduce your risk of infection.                                    Remember - BRUSH YOUR TEETH THE MORNING OF SURGERY WITH YOUR REGULAR TOOTHPASTE  DENTURES WILL BE REMOVED PRIOR TO SURGERY PLEASE DO NOT APPLY "Poly grip" OR ADHESIVES!!!   Do NOT smoke after Midnight   Take these medicines the morning of surgery with A SIP OF  WATER: diltiazem,silodosin.Use Eye drops as usual.  DO NOT TAKE ANY ORAL DIABETIC MEDICATIONS DAY OF YOUR SURGERY  Stop vitamins,supplements and over the counter meds 7 days before surgery.  HOLD Eliquis : after 06/05/23                              You may not have any metal on your body including hair pins, jewelry, and body piercing             Do not wear lotions, powders, perfumes/cologne, or deodorant              Men may shave face and neck.   Do not bring valuables to the hospital. [facility] IS NOT             RESPONSIBLE   FOR VALUABLES.   Contacts, glasses, or bridgework may not be worn into surgery.   Bring small overnight bag day of surgery.   DO NOT BRING YOUR HOME MEDICATIONS TO THE HOSPITAL. PHARMACY WILL DISPENSE MEDICATIONS LISTED ON YOUR MEDICATION LIST TO YOU DURING YOUR ADMISSION IN THE HOSPITAL!    Patients discharged on the day of surgery will not be allowed to drive home.  Someone NEEDS to stay with you for the first 24 hours after anesthesia.   Special Instructions: Bring a copy of your healthcare power of attorney and living will documents         the day of surgery if you haven't scanned them before.              Please read over the following fact sheets you were given: IF YOU HAVE QUESTIONS ABOUT YOUR PRE-OP INSTRUCTIONS PLEASE CALL [phone]    [facility] - Preparing for Surgery Before surgery, you can play an important role.  Because skin is not sterile, your skin needs to be as free of germs as possible.  You can reduce the number of germs on your skin by washing with CHG (chlorahexidine  gluconate) soap before surgery.  CHG is an antiseptic cleaner which kills germs and bonds with the skin to continue killing germs even after washing. Please DO NOT use if you have an allergy to CHG or antibacterial soaps.  If your skin becomes reddened/irritated stop using the CHG and inform your nurse when you arrive at Short Stay. Do not shave (including legs and underarms) for at least 48 hours prior to the first CHG shower.  You may shave your face/neck. Please follow these instructions carefully:  1.  Shower with CHG Soap the night before surgery and the  morning of Surgery.  2.  If you choose to wash your hair, wash your hair first as usual with your  normal  shampoo.  3.  After you shampoo, rinse your hair and body thoroughly to remove the  shampoo.                           4.  Use CHG as you would any other liquid soap.  You can apply chg directly  to the skin and wash                       Gently with a scrungie or clean washcloth.  5.  Apply the CHG Soap to your body ONLY FROM THE NECK DOWN.   Do not use on face/ open  Wound or open sores. Avoid contact with eyes, ears mouth and genitals (private parts).                       Wash face,  Genitals (private parts) with your normal soap.             6.  Wash thoroughly, paying special attention to the area where your surgery  will be performed.  7.  Thoroughly rinse your body with warm water from the neck down.  8.  DO NOT shower/wash with your normal soap after using and rinsing off  the CHG Soap.                9.  Pat yourself dry with a clean towel.            10.  Wear clean pajamas.            11.  Place clean sheets on your bed the night of your first shower and do not  sleep with pets. Day of Surgery : Do not apply any lotions/deodorants the morning of surgery.  Please wear clean clothes to the hospital/surgery center.  FAILURE TO FOLLOW THESE INSTRUCTIONS MAY RESULT IN THE CANCELLATION OF YOUR  SURGERY PATIENT SIGNATURE_________________________________  NURSE SIGNATURE__________________________________  ________________________________________________________________________

## 2023-05-24 NOTE — Progress Notes (Signed)
For Anesthesia: PCP - Garlan Fillers, MD  Cardiologist - Swaziland, Peter M, MD  Mealor, Roberts Gaudy, MD Electrophysiology  Clearance: Reather Littler: NP: 05/12/23 Bowel Prep reminder:  Chest x-ray -  EKG - 11/04/22 Stress Test -  ECHO -  Cardiac Cath -  Pacemaker/ICD device last checked: 05/25/23 Pacemaker orders received: Yes: EPIC: 04/28/23 Device Rep notified: Yes: [email or web address] Spinal Cord Stimulator:N/A  Sleep Study - N/A CPAP -   Fasting Blood Sugar - N/A Checks Blood Sugar __0___ times a day Date and result of last Hgb A1c-  Last dose of GLP1 agonist- N/A GLP1 instructions:   Last dose of SGLT-2 inhibitors- N/A SGLT-2 instructions:   Blood Thinner Instructions:Eliquis will be hold after: 06/05/23 Aspirin Instructions: Last Dose:  Activity level: Can go up a flight of stairs and activities of daily living without stopping and without chest pain and/or shortness of breath   Able to exercise without chest pain and/or shortness of breath  Anesthesia review: Hx: HTN,DIA,CAD(CABG),Rt. BBB,PAF.  Patient denies shortness of breath, fever, cough and chest pain at PAT appointment   Patient verbalized understanding of instructions that were given to them at the PAT appointment. Patient was also instructed that they will need to review over the PAT instructions again at home before surgery.

## 2023-05-25 ENCOUNTER — Other Ambulatory Visit: Payer: Self-pay

## 2023-05-25 ENCOUNTER — Ambulatory Visit (INDEPENDENT_AMBULATORY_CARE_PROVIDER_SITE_OTHER): Payer: Medicare PPO

## 2023-05-25 ENCOUNTER — Encounter (HOSPITAL_COMMUNITY)
Admission: RE | Admit: 2023-05-25 | Discharge: 2023-05-25 | Disposition: A | Discharge status: Home / Self Care | Payer: Medicare PPO | Source: Ambulatory Visit | Attending: Surgery | Admitting: Surgery

## 2023-05-25 ENCOUNTER — Encounter (HOSPITAL_COMMUNITY): Payer: Self-pay

## 2023-05-25 VITALS — BP 131/78 | HR 81 | Temp 98.0°F | Ht 70.0 in | Wt 158.0 lb

## 2023-05-25 DIAGNOSIS — Z01812 Encounter for preprocedural laboratory examination: Secondary | ICD-10-CM | POA: Diagnosis not present

## 2023-05-25 DIAGNOSIS — I10 Essential (primary) hypertension: Secondary | ICD-10-CM | POA: Diagnosis not present

## 2023-05-25 DIAGNOSIS — E119 Type 2 diabetes mellitus without complications: Secondary | ICD-10-CM | POA: Diagnosis not present

## 2023-05-25 DIAGNOSIS — I495 Sick sinus syndrome: Secondary | ICD-10-CM

## 2023-05-25 HISTORY — DX: Presence of cardiac pacemaker: Z95.0

## 2023-05-25 LAB — BASIC METABOLIC PANEL
Anion gap: 11 (ref 5–15)
BUN: 22 mg/dL (ref 8–23)
CO2: 29 mmol/L (ref 22–32)
Calcium: 9.7 mg/dL (ref 8.9–10.3)
Chloride: 100 mmol/L (ref 98–111)
Creatinine, Ser: 0.87 mg/dL (ref 0.61–1.24)
GFR, Estimated: >60 mL/min (ref >60)
Glucose, Bld: 187 mg/dL — ABNORMAL HIGH (ref 70–99)
Potassium: 4 mmol/L (ref 3.5–5.1)
Sodium: 140 mmol/L (ref 135–145)

## 2023-05-25 LAB — CBC
HCT: 45.5 % (ref 39.0–52.0)
Hemoglobin: 14.6 g/dL (ref 13.0–17.0)
MCH: 30.5 pg (ref 26.0–34.0)
MCHC: 32.1 g/dL (ref 30.0–36.0)
MCV: 95.2 fL (ref 80.0–100.0)
Platelets: 194 10*3/uL (ref 150–400)
RBC: 4.78 MIL/uL (ref 4.22–5.81)
RDW: 13.1 % (ref 11.5–15.5)
WBC: 6.9 10*3/uL (ref 4.0–10.5)
nRBC: 0 % (ref 0.0–0.2)

## 2023-05-25 LAB — HEMOGLOBIN A1C
Hgb A1c MFr Bld: 7.6 % — ABNORMAL HIGH (ref 4.8–5.6)
Mean Plasma Glucose: 171.42 mg/dL

## 2023-05-25 LAB — GLUCOSE, CAPILLARY: Glucose-Capillary: 188 mg/dL — ABNORMAL HIGH (ref 70–99)

## 2023-05-26 LAB — CUP PACEART REMOTE DEVICE CHECK
Battery Impedance: 736 Ohm
Battery Remaining Longevity: 73 mo
Battery Voltage: 2.78 V
Brady Statistic AP VP Percent: 0 %
Brady Statistic AP VS Percent: 95 %
Brady Statistic AS VP Percent: 0 %
Brady Statistic AS VS Percent: 5 %
Date Time Interrogation Session: 20250218075007
Implantable Lead Connection Status: 753985
Implantable Lead Connection Status: 753985
Implantable Lead Implant Date: 20080319
Implantable Lead Implant Date: 20080319
Implantable Lead Location: 753859
Implantable Lead Location: 753860
Implantable Lead Model: 5076
Implantable Lead Model: 5076
Implantable Pulse Generator Implant Date: 20170711
Lead Channel Impedance Value: 449 Ohm
Lead Channel Impedance Value: 695 Ohm
Lead Channel Pacing Threshold Amplitude: 0.75 V
Lead Channel Pacing Threshold Amplitude: 1 V
Lead Channel Pacing Threshold Pulse Width: 0.4 ms
Lead Channel Pacing Threshold Pulse Width: 0.4 ms
Lead Channel Setting Pacing Amplitude: 2 V
Lead Channel Setting Pacing Amplitude: 2.5 V
Lead Channel Setting Pacing Pulse Width: 0.4 ms
Lead Channel Setting Sensing Sensitivity: 2 mV
Zone Setting Status: 755011
Zone Setting Status: 755011

## 2023-05-28 NOTE — Progress Notes (Signed)
Case: [id number] Date/Time: 06/08/23 1515   Procedure: LAPAROSCOPIC LEFT INGUINAL HERNIA REPAIR WITH MESH (Left)   Anesthesia type: General   Pre-op diagnosis: LEFT INGUINAL HERNIA   Location: [facility] ROOM 02 / [facility]   Surgeons: Stechschulte, Hyman Hopes, MD       DISCUSSION: Peter Reynolds is a 89 yo male who presents to PAT prior to surgery above. 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  Patient follows with Cardiology for above problems. Last seen on 02/03/23 and noted to be doing well. Patient denied any symptoms. Diabetes not well controlled. Advised f/u in 1 year. Had tele visit for cardiac clearance on 05/12/23 and was cleared:  "Preoperative Cardiovascular Risk Assessment: Hernia surgery with Dr. Dossie Der    Peter Reynolds's perioperative risk of a major cardiac event is 0.9% according to the Revised Cardiac Risk Index (RCRI).  Therefore, he is at low risk for perioperative complications.  His functional capacity is fair at 5.07 METs according to the Duke Activity Status Index (DASI). Recommendations: According to ACC/AHA guidelines, no further cardiovascular testing needed.  The patient may proceed to surgery at acceptable risk.   Antiplatelet and/or Anticoagulation Recommendations: Eliquis (Apixaban) can be held for 2 days prior to surgery.  Please resume post op when felt to be safe."  Device orders: Electrophysiologist's Recommendations:   Have magnet available. Provide continuous ECG monitoring when magnet is used or reprogramming is to be performed.  Procedure may interfere with device function.  Magnet should be placed over device during procedure.  LD Eliquis: Eliquis will be hold after: 06/05/23  VS: BP 131/78   Pulse 81   Temp 36.7 C (Oral)   Ht 5\' 10"  (1.778 m)   Wt 71.7 kg   SpO2 98%   BMI 22.67 kg/m   PROVIDERS: Garlan Fillers, MD Primary Cardiologist:   Peter [place]azil[place], MD  LABS: Labs reviewed: Acceptable for surgery. (all labs ordered are listed, but only abnormal results are displayed)  Labs Reviewed  GLUCOSE, CAPILLARY - Abnormal; Notable for the following components:      Result Value   Glucose-Capillary 188 (*)    All other components within normal limits  BASIC METABOLIC PANEL - Abnormal; Notable for the following components:   Glucose, Bld 187 (*)    All other components within normal limits  HEMOGLOBIN A1C - Abnormal; Notable for the following components:   Hgb A1c MFr Bld 7.6 (*)    All other components within normal limits  CBC     IMAGES:   EKG:   CV:  Device check 05/25/23:  Scheduled remote reviewed. Normal device function.   Known PAF, on OAC, AF burden is 3.3% of the time.  Good ventricular rate control.  The high ventricular arrhythmias are known short bursts of ventricular lead noise.  This is not a new finding. Next remote 91 days.  Past Medical History:  Diagnosis Date   CAD    s/p CABG   Diabetes mellitus    Dyslipidemia    Essential hypertension, benign    Hematuria    Presence of permanent cardiac pacemaker    RBBB    Sick sinus syndrome (HCC)    s/p PPM (MDT) by Dr Reyes Ivan 2008    Past Surgical History:  Procedure Laterality Date   CATARACT EXTRACTION, BILATERAL     Coronary artery bypass grafting.     lima-lad,svg-OM1-2,svg-rca  Dual-chamber permanent pacemaker implant 2008  06/23/2006   MDT Adapta implanted by Dr Reyes Ivan   EP IMPLANTABLE DEVICE N/A 10/15/2015   Procedure:  PPM Generator Changeout;  Surgeon: Hillis Range, MD;  Location: [facility] INVASIVE CV LAB;  Service: Cardiovascular;  Laterality: N/A;   HAND SURGERY Bilateral 04/06/2002   2004/2005   HERNIA REPAIR  04/06/1993   right    MEDICATIONS:  apixaban (ELIQUIS) 5 MG TABS tablet   Cholecalciferol (VITAMIN D3) 125 MCG (5000 UT) TABS   diltiazem (TIAZAC) 360 MG 24 hr capsule   dorzolamide-timolol (COSOPT) 2-0.5 % ophthalmic  solution   lisinopril-hydrochlorothiazide (PRINZIDE,ZESTORETIC) 20-12.5 MG per tablet   metFORMIN (GLUCOPHAGE-XR) 500 MG 24 hr tablet   Multiple Vitamin (MULTI-VITAMIN PO)   nitroGLYCERIN (NITROSTAT) 0.4 MG SL tablet   pravastatin (PRAVACHOL) 40 MG tablet   silodosin (RAPAFLO) 4 MG CAPS capsule   vitamin E 180 MG (400 UNITS) capsule   zinc gluconate 50 MG tablet   No current facility-administered medications for this encounter.   Marcille Blanco [facility] Short Stay/Anesthesiology [facility] Phone [phone] 05/28/2023 9:01 AM

## 2023-05-28 NOTE — Anesthesia Preprocedure Evaluation (Addendum)
 Anesthesia Evaluation  Patient identified by MRN, date of birth, ID band Patient awake    Reviewed: Allergy & Precautions, H&P , NPO status , Patient's Chart, lab work & pertinent test results  Airway Mallampati: II  TM Distance: >3 FB Neck ROM: Full    Dental no notable dental hx. (+) Teeth Intact, Missing, Dental Advisory Given   Pulmonary neg pulmonary ROS   Pulmonary exam normal breath sounds clear to auscultation       Cardiovascular Exercise Tolerance: Good hypertension, + CAD  negative cardio ROS Normal cardiovascular exam+ dysrhythmias + pacemaker  Rhythm:Regular Rate:Normal     Neuro/Psych negative neurological ROS  negative psych ROS   GI/Hepatic negative GI ROS, Neg liver ROS,,,  Endo/Other  negative endocrine ROSdiabetes, Type 2    Renal/GU negative Renal ROS  negative genitourinary   Musculoskeletal negative musculoskeletal ROS (+)    Abdominal   Peds negative pediatric ROS (+)  Hematology negative hematology ROS (+)   Anesthesia Other Findings   Reproductive/Obstetrics negative OB ROS                             Anesthesia Physical Anesthesia Plan  ASA: 3  Anesthesia Plan: General   Post-op Pain Management: Minimal or no pain anticipated and Tylenol PO (pre-op)*   Induction: Intravenous  PONV Risk Score and Plan: 2 and Ondansetron and TIVA  Airway Management Planned: Oral ETT  Additional Equipment: None  Intra-op Plan:   Post-operative Plan: Extubation in OR  Informed Consent: I have reviewed the patients History and Physical, chart, labs and discussed the procedure including the risks, benefits and alternatives for the proposed anesthesia with the patient or authorized representative who has indicated his/her understanding and acceptance.       Plan Discussed with:   Anesthesia Plan Comments: (DISCUSSION: Peter Reynolds is a 89 yo male who presents  to PAT prior to surgery above. 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   Patient follows with Cardiology for above problems. Last seen on 02/03/23 and noted to be doing well. Patient denied any symptoms. Diabetes not well controlled. Advised f/u in 1 year. Had tele visit for cardiac clearance on 05/12/23 and was cleared:   "Preoperative Cardiovascular Risk Assessment: Hernia surgery with Dr. Dossie Der    Peter Reynolds's perioperative risk of a major cardiac event is 0.9% according to the Revised Cardiac Risk Index (RCRI).  Therefore, he is at low risk for perioperative complications.  His functional capacity is fair at 5.07 METs according to the Duke Activity Status Index (DASI). Recommendations: According to ACC/AHA guidelines, no further cardiovascular testing needed.  The patient may proceed to surgery at acceptable risk.   Antiplatelet and/or Anticoagulation Recommendations: Eliquis (Apixaban) can be held for 2 days prior to surgery.  Please resume post op when felt to be safe."   Device orders: Electrophysiologist's Recommendations:   Have magnet available. Provide continuous ECG monitoring when magnet is used or reprogramming is to be performed.  Procedure may interfere with device function.  Magnet should be placed over device during procedure.   )        Anesthesia Quick Evaluation

## 2023-06-08 ENCOUNTER — Ambulatory Visit (HOSPITAL_COMMUNITY)
Admission: RE | Admit: 2023-06-08 | Discharge: 2023-06-08 | Disposition: A | Discharge status: Home / Self Care | Payer: Medicare PPO | Source: Ambulatory Visit | Attending: Surgery | Admitting: Surgery

## 2023-06-08 ENCOUNTER — Ambulatory Visit (HOSPITAL_COMMUNITY): Payer: Self-pay | Admitting: Medical

## 2023-06-08 ENCOUNTER — Encounter (HOSPITAL_COMMUNITY)
Admission: RE | Disposition: A | Discharge status: Home / Self Care | Payer: Self-pay | Source: Ambulatory Visit | Attending: Surgery

## 2023-06-08 ENCOUNTER — Encounter (HOSPITAL_COMMUNITY): Payer: Self-pay | Admitting: Surgery

## 2023-06-08 ENCOUNTER — Ambulatory Visit (HOSPITAL_BASED_OUTPATIENT_CLINIC_OR_DEPARTMENT_OTHER): Admitting: Anesthesiology

## 2023-06-08 DIAGNOSIS — D179 Benign lipomatous neoplasm, unspecified: Secondary | ICD-10-CM | POA: Diagnosis not present

## 2023-06-08 DIAGNOSIS — E119 Type 2 diabetes mellitus without complications: Secondary | ICD-10-CM

## 2023-06-08 DIAGNOSIS — D176 Benign lipomatous neoplasm of spermatic cord: Secondary | ICD-10-CM | POA: Insufficient documentation

## 2023-06-08 DIAGNOSIS — K4090 Unilateral inguinal hernia, without obstruction or gangrene, not specified as recurrent: Secondary | ICD-10-CM | POA: Diagnosis not present

## 2023-06-08 DIAGNOSIS — I10 Essential (primary) hypertension: Secondary | ICD-10-CM

## 2023-06-08 DIAGNOSIS — Z7901 Long term (current) use of anticoagulants: Secondary | ICD-10-CM | POA: Insufficient documentation

## 2023-06-08 DIAGNOSIS — Z7984 Long term (current) use of oral hypoglycemic drugs: Secondary | ICD-10-CM | POA: Insufficient documentation

## 2023-06-08 DIAGNOSIS — Z79899 Other long term (current) drug therapy: Secondary | ICD-10-CM | POA: Insufficient documentation

## 2023-06-08 DIAGNOSIS — Z950 Presence of cardiac pacemaker: Secondary | ICD-10-CM | POA: Diagnosis not present

## 2023-06-08 DIAGNOSIS — I2510 Atherosclerotic heart disease of native coronary artery without angina pectoris: Secondary | ICD-10-CM

## 2023-06-08 DIAGNOSIS — I4891 Unspecified atrial fibrillation: Secondary | ICD-10-CM | POA: Diagnosis not present

## 2023-06-08 DIAGNOSIS — Z951 Presence of aortocoronary bypass graft: Secondary | ICD-10-CM | POA: Diagnosis not present

## 2023-06-08 HISTORY — PX: INGUINAL HERNIA REPAIR: SHX194

## 2023-06-08 LAB — GLUCOSE, CAPILLARY
Glucose-Capillary: 148 mg/dL — ABNORMAL HIGH (ref 70–99)
Glucose-Capillary: 124 mg/dL — ABNORMAL HIGH (ref 70–99)
Glucose-Capillary: 171 mg/dL — ABNORMAL HIGH (ref 70–99)

## 2023-06-08 SURGERY — REPAIR, HERNIA, INGUINAL, LAPAROSCOPIC
Anesthesia: General | Laterality: Left

## 2023-06-08 MED ORDER — OXYCODONE HCL 5 MG/5ML PO SOLN: 5.0000 mg | Freq: Once | ORAL | Status: AC | PRN

## 2023-06-08 MED ORDER — CEFAZOLIN SODIUM-DEXTROSE 2-4 GM/100ML-% IV SOLN
2.0000 g | INTRAVENOUS | Status: AC
  Administered 2023-06-08: 2 g via INTRAVENOUS
  Filled 2023-06-08: qty 100

## 2023-06-08 MED ORDER — HEPARIN SODIUM (PORCINE) 5000 UNIT/ML IJ SOLN
5000.0000 [IU] | Freq: Once | INTRAMUSCULAR | Status: AC
  Administered 2023-06-08: 5000 [IU] via SUBCUTANEOUS
  Filled 2023-06-08: qty 1

## 2023-06-08 MED ORDER — SUGAMMADEX SODIUM 200 MG/2ML IV SOLN
INTRAVENOUS | Status: DC | PRN
  Administered 2023-06-08: 200 mg via INTRAVENOUS

## 2023-06-08 MED ORDER — BUPIVACAINE-EPINEPHRINE (PF) 0.25% -1:200000 IJ SOLN
INTRAMUSCULAR | Status: DC | PRN
  Administered 2023-06-08: 30 mL

## 2023-06-08 MED ORDER — ONDANSETRON HCL 4 MG/2ML IJ SOLN
INTRAMUSCULAR | Status: DC | PRN
  Administered 2023-06-08: 4 mg via INTRAVENOUS

## 2023-06-08 MED ORDER — PROPOFOL 10 MG/ML IV BOLUS
INTRAVENOUS | Status: AC
  Filled 2023-06-08: qty 20

## 2023-06-08 MED ORDER — ROCURONIUM BROMIDE 10 MG/ML (PF) SYRINGE
PREFILLED_SYRINGE | INTRAVENOUS | Status: AC
  Filled 2023-06-08: qty 10

## 2023-06-08 MED ORDER — OXYCODONE HCL 5 MG/5ML PO SOLN
5.0000 mg | Freq: Once | ORAL | Status: DC | PRN
Start: 2023-06-08 — End: 2023-06-08

## 2023-06-08 MED ORDER — LIDOCAINE HCL (PF) 2 % IJ SOLN
INTRAMUSCULAR | Status: AC
  Filled 2023-06-08: qty 5

## 2023-06-08 MED ORDER — BUPIVACAINE LIPOSOME 1.3 % IJ SUSP
INTRAMUSCULAR | Status: DC | PRN
  Administered 2023-06-08: 20 mL

## 2023-06-08 MED ORDER — INSULIN ASPART 100 UNIT/ML IJ SOLN: 0.0000 [IU] | INTRAMUSCULAR | Status: DC | PRN

## 2023-06-08 MED ORDER — MEPERIDINE HCL 50 MG/ML IJ SOLN: 6.2500 mg | INTRAMUSCULAR | Status: DC | PRN

## 2023-06-08 MED ORDER — ACETAMINOPHEN 500 MG PO TABS
1000.0000 mg | ORAL_TABLET | ORAL | Status: AC
  Administered 2023-06-08: 1000 mg via ORAL
  Filled 2023-06-08: qty 2

## 2023-06-08 MED ORDER — ONDANSETRON HCL 4 MG/2ML IJ SOLN: 4.0000 mg | Freq: Once | INTRAMUSCULAR | Status: DC | PRN

## 2023-06-08 MED ORDER — BUPIVACAINE-EPINEPHRINE (PF) 0.25% -1:200000 IJ SOLN
INTRAMUSCULAR | Status: AC
  Filled 2023-06-08: qty 30

## 2023-06-08 MED ORDER — LACTATED RINGERS IV SOLN: INTRAVENOUS | Status: DC | PRN

## 2023-06-08 MED ORDER — OXYCODONE HCL 5 MG PO TABS
ORAL_TABLET | ORAL | Status: AC
  Filled 2023-06-08: qty 1

## 2023-06-08 MED ORDER — CHLORHEXIDINE GLUCONATE CLOTH 2 % EX PADS: 6.0000 | MEDICATED_PAD | Freq: Once | CUTANEOUS | Status: DC

## 2023-06-08 MED ORDER — DEXAMETHASONE SODIUM PHOSPHATE 10 MG/ML IJ SOLN
INTRAMUSCULAR | Status: AC
  Filled 2023-06-08: qty 1

## 2023-06-08 MED ORDER — ACETAMINOPHEN 325 MG PO TABS: 325.0000 mg | ORAL_TABLET | ORAL | Status: DC | PRN

## 2023-06-08 MED ORDER — PHENYLEPHRINE HCL (PRESSORS) 10 MG/ML IV SOLN: INTRAVENOUS | Status: DC | PRN

## 2023-06-08 MED ORDER — PHENYLEPHRINE 80 MCG/ML (10ML) SYRINGE FOR IV PUSH (FOR BLOOD PRESSURE SUPPORT)
PREFILLED_SYRINGE | INTRAVENOUS | Status: DC | PRN
  Administered 2023-06-08: 160 ug via INTRAVENOUS

## 2023-06-08 MED ORDER — ROCURONIUM BROMIDE 100 MG/10ML IV SOLN
INTRAVENOUS | Status: DC | PRN
  Administered 2023-06-08: 50 mg via INTRAVENOUS

## 2023-06-08 MED ORDER — FENTANYL CITRATE PF 50 MCG/ML IJ SOSY
25.0000 ug | PREFILLED_SYRINGE | INTRAMUSCULAR | Status: DC | PRN
  Administered 2023-06-08 (×2): 25 ug via INTRAVENOUS

## 2023-06-08 MED ORDER — LABETALOL HCL 5 MG/ML IV SOLN
INTRAVENOUS | Status: DC | PRN
  Administered 2023-06-08 (×2): 5 mg via INTRAVENOUS

## 2023-06-08 MED ORDER — LACTATED RINGERS IV SOLN: INTRAVENOUS | Status: DC

## 2023-06-08 MED ORDER — FENTANYL CITRATE (PF) 100 MCG/2ML IJ SOLN
INTRAMUSCULAR | Status: DC | PRN
  Administered 2023-06-08: 50 ug via INTRAVENOUS
  Administered 2023-06-08: 25 ug via INTRAVENOUS

## 2023-06-08 MED ORDER — FENTANYL CITRATE PF 50 MCG/ML IJ SOSY
25.0000 ug | PREFILLED_SYRINGE | INTRAMUSCULAR | Status: DC | PRN
Start: 2023-06-08 — End: 2023-06-08

## 2023-06-08 MED ORDER — ACETAMINOPHEN 160 MG/5ML PO SOLN: 325.0000 mg | ORAL | Status: DC | PRN

## 2023-06-08 MED ORDER — FENTANYL CITRATE PF 50 MCG/ML IJ SOSY
PREFILLED_SYRINGE | INTRAMUSCULAR | Status: DC
Start: 2023-06-08 — End: 2023-06-09
  Filled 2023-06-08: qty 1

## 2023-06-08 MED ORDER — LIDOCAINE HCL (CARDIAC) PF 100 MG/5ML IV SOSY
PREFILLED_SYRINGE | INTRAVENOUS | Status: DC | PRN
  Administered 2023-06-08: 60 mg via INTRAVENOUS

## 2023-06-08 MED ORDER — CHLORHEXIDINE GLUCONATE 0.12 % MT SOLN
15.0000 mL | Freq: Once | OROMUCOSAL | Status: AC
  Administered 2023-06-08: 15 mL via OROMUCOSAL

## 2023-06-08 MED ORDER — BUPIVACAINE LIPOSOME 1.3 % IJ SUSP
INTRAMUSCULAR | Status: AC
  Filled 2023-06-08: qty 20

## 2023-06-08 MED ORDER — FENTANYL CITRATE (PF) 100 MCG/2ML IJ SOLN
INTRAMUSCULAR | Status: AC
Start: 2023-06-08 — End: ?
  Filled 2023-06-08: qty 2

## 2023-06-08 MED ORDER — BUPIVACAINE LIPOSOME 1.3 % IJ SUSP: 20.0000 mL | Freq: Once | INTRAMUSCULAR | Status: DC

## 2023-06-08 MED ORDER — PHENYLEPHRINE 80 MCG/ML (10ML) SYRINGE FOR IV PUSH (FOR BLOOD PRESSURE SUPPORT)
PREFILLED_SYRINGE | INTRAVENOUS | Status: AC
  Filled 2023-06-08: qty 10

## 2023-06-08 MED ORDER — DEXAMETHASONE SODIUM PHOSPHATE 10 MG/ML IJ SOLN
INTRAMUSCULAR | Status: DC | PRN
  Administered 2023-06-08: 8 mg via INTRAVENOUS

## 2023-06-08 MED ORDER — PROPOFOL 10 MG/ML IV BOLUS
INTRAVENOUS | Status: DC | PRN
  Administered 2023-06-08: 100 mg via INTRAVENOUS

## 2023-06-08 MED ORDER — ONDANSETRON HCL 4 MG/2ML IJ SOLN
4.0000 mg | Freq: Once | INTRAMUSCULAR | Status: DC | PRN
Start: 2023-06-08 — End: 2023-06-08

## 2023-06-08 MED ORDER — OXYCODONE HCL 5 MG PO TABS: 5.0000 mg | ORAL_TABLET | Freq: Once | ORAL | Status: DC | PRN

## 2023-06-08 MED ORDER — OXYCODONE HCL 5 MG PO TABS
5.0000 mg | ORAL_TABLET | Freq: Once | ORAL | Status: AC | PRN
  Administered 2023-06-08: 5 mg via ORAL

## 2023-06-08 MED ORDER — ORAL CARE MOUTH RINSE: 15.0000 mL | Freq: Once | OROMUCOSAL | Status: AC

## 2023-06-08 MED ORDER — ONDANSETRON HCL 4 MG/2ML IJ SOLN
INTRAMUSCULAR | Status: AC
  Filled 2023-06-08: qty 2

## 2023-06-08 MED ORDER — TRAMADOL HCL 50 MG PO TABS: 50.0000 mg | ORAL_TABLET | Freq: Four times a day (QID) | ORAL | 0 refills | Status: AC | PRN

## 2023-06-08 MED ORDER — SUGAMMADEX SODIUM 200 MG/2ML IV SOLN
INTRAVENOUS | Status: AC
Start: 2023-06-08 — End: ?
  Filled 2023-06-08: qty 2

## 2023-06-08 SURGICAL SUPPLY — 35 items
BAG COUNTER SPONGE SURGICOUNT (BAG) ×1 IMPLANT
CABLE HIGH FREQUENCY MONO STRZ (ELECTRODE) ×1 IMPLANT
CHLORAPREP W/TINT 26 (MISCELLANEOUS) ×1 IMPLANT
COVER SURGICAL LIGHT HANDLE (MISCELLANEOUS) ×1 IMPLANT
DERMABOND ADVANCED .7 DNX12 (GAUZE/BANDAGES/DRESSINGS) ×1 IMPLANT
ELECT REM PT RETURN 15FT ADLT (MISCELLANEOUS) ×1 IMPLANT
GLOVE BIO SURGEON STRL SZ7.5 (GLOVE) ×1 IMPLANT
GLOVE INDICATOR 8.0 STRL GRN (GLOVE) ×1 IMPLANT
GOWN STRL REUS W/ TWL XL LVL3 (GOWN DISPOSABLE) ×1 IMPLANT
GRASPER SUT TROCAR 14GX15 (MISCELLANEOUS) ×1 IMPLANT
IRRIG SUCT STRYKERFLOW 2 WTIP (MISCELLANEOUS) IMPLANT
IRRIGATION SUCT STRKRFLW 2 WTP (MISCELLANEOUS) IMPLANT
KIT BASIN OR (CUSTOM PROCEDURE TRAY) ×1 IMPLANT
KIT TURNOVER KIT A (KITS) IMPLANT
MARKER SKIN DUAL TIP RULER LAB (MISCELLANEOUS) ×1 IMPLANT
MESH 3DMAX 5X7 LT XLRG (Mesh General) IMPLANT
NDL INSUFFLATION 14GA 120MM (NEEDLE) ×1 IMPLANT
NEEDLE INSUFFLATION 14GA 120MM (NEEDLE) ×1 IMPLANT
POUCH RETRIEVAL ECOSAC 10 (ENDOMECHANICALS) IMPLANT
RELOAD STAPLE 4.0 BLU F/HERNIA (INSTRUMENTS) ×1 IMPLANT
RELOAD STAPLE 4.8 BLK F/HERNIA (STAPLE) IMPLANT
RELOAD STAPLE HERNIA 4.0 BLUE (INSTRUMENTS) ×1 IMPLANT
RELOAD STAPLE HERNIA 4.8 BLK (STAPLE) ×1 IMPLANT
SCISSORS LAP 5X35 DISP (ENDOMECHANICALS) ×1 IMPLANT
SET TUBE SMOKE EVAC HIGH FLOW (TUBING) ×1 IMPLANT
SLEEVE Z-THREAD 5X100MM (TROCAR) ×1 IMPLANT
SPIKE FLUID TRANSFER (MISCELLANEOUS) IMPLANT
STAPLER HERNIA 12 8.5 360D (INSTRUMENTS) ×1 IMPLANT
SUT MNCRL AB 4-0 PS2 18 (SUTURE) ×1 IMPLANT
SUT VICRYL 0 UR6 27IN ABS (SUTURE) IMPLANT
TOWEL OR 17X26 10 PK STRL BLUE (TOWEL DISPOSABLE) ×1 IMPLANT
TRAY FOL W/BAG SLVR 16FR STRL (SET/KITS/TRAYS/PACK) IMPLANT
TRAY LAPAROSCOPIC (CUSTOM PROCEDURE TRAY) ×1 IMPLANT
TROCAR ADV FIXATION 12X100MM (TROCAR) ×1 IMPLANT
TROCAR Z-THREAD FIOS 5X100MM (TROCAR) ×1 IMPLANT

## 2023-06-08 NOTE — Transfer of Care (Signed)
 Immediate Anesthesia Transfer of Care Note  Patient: Peter Reynolds  Procedure(s) Performed: LAPAROSCOPIC LEFT INGUINAL HERNIA REPAIR WITH MESH (Left)  Patient Location: PACU  Anesthesia Type:General  Level of Consciousness: drowsy  Airway & Oxygen Therapy: Patient Spontanous Breathing and Patient connected to face mask oxygen  Post-op Assessment: Report given to RN and Post -op Vital signs reviewed and stable  Post vital signs: Reviewed and stable  Last Vitals:  Vitals Value Taken Time  BP 152/79 06/08/23 1639  Temp    Pulse 61 06/08/23 1643  Resp 17 06/08/23 1643  SpO2 100 % 06/08/23 1643  Vitals shown include unfiled device data.  Last Pain:  Vitals:   06/08/23 1203  TempSrc:   PainSc: 0-No pain         Complications: No notable events documented.

## 2023-06-08 NOTE — Discharge Instructions (Signed)

## 2023-06-08 NOTE — Op Note (Signed)
 Patient: Peter Reynolds (1928-06-30, [id number])  Date of Surgery: 06/08/2023  Preoperative Diagnosis: LEFT INGUINAL HERNIA   Postoperative Diagnosis: LEFT INGUINAL HERNIA   Surgical Procedure: LAPAROSCOPIC LEFT INGUINAL HERNIA REPAIR WITH MESH  Operative Team Members:  Surgeons and Role:    * Bell Carbo, Hyman Hopes, MD - Primary   Anesthesiologist: Bethena Midget, MD CRNA: Jamelle Rushing, CRNA; Florene Route, CRNA   Anesthesia: General   Fluids:  No intake/output data recorded.  Complications: None  Drains:  None  Specimen: cord lipoma  Disposition:  PACU - hemodynamically stable.  Plan of Care: Discharge to home after PACU  Indications for Procedure: Peter Reynolds is a 89 y.o. male who presented with a left inguinal hernia.  Left-sided inguinal hernia, symptomatic with pain and discomfort during activities. No nausea or abnormal bowel movements. Previous right-sided hernia repair in 1995. Hernia identified on a 2013 CT scan. Discussed risks of untreated hernia, including rare complications like intestine entrapment and infection. Patient prefers surgical repair due to pain and activity limitations. Recommended laparoscopic left inguinal hernia with mesh. We discussed the procedure, its risks, benefits and alternatives. After a full discussion and all questions answered the patient granted consent to proceed.   Findings:  Technique: Transabdominal preperitoneal (TAPP) Hernia Location: left indirect inguinal hernia and cord lipoma Mesh Size &Type:  Bard 3D max extra large left sided mesh Mesh Fixation: Endo-Universal hernia stapler  Infection status: Patient: Private Patient Elective Case Case: Elective Infection Present At Time Of Surgery (PATOS): None   Description of Procedure:  The patient was positioned supine, padded and secured to the bed, with both arms tucked.  The abdomen was widely prepped and draped.  A time out procedure was  performed.  A 1 cm infraumbilical incision was made.  The abdomen was entered without trauma to the underlying viscera.  The abdomen was insufflated to 15 mm of Hg.  A 12 mm trocar was inserted at the periumbilical incision.  Additional 5 mm trocars were placed in the left and right abdomen.  There was no trauma to the underlying viscera.  There was an indirect hernia on the LEFT.  Utilizing a transabdominal pre peritoneal technique (TAPP), a horizontal incision was made in the peritoneum, immediately below the umbilicus.  Dissection was carried out in the pre peritoneal space down to the level of the hernia sac which was reduced into the peritoneal cavity completely.  The cord contents were parietalized and preserved.  A hard cord lipoma was removed and passed off the field as a specimen.  A large pre peritoneal dissection was performed to uncover the direct, indirect, femoral and obturator spaces.  Cooper's ligament was uncovered medially and the psoas muscle uncovered laterally.  The mesh, as documented above, was opened and advanced into the pre peritoneal position so that it more than adequately covered the indirect, direct, femoral and obturator spaces.  The mesh laid flat, with no inferior folds and covered the entire myopectineal orifice.  The mesh was fixated with the endo-universal hernia stapler to Cooper's ligament and the posterior aspect of the rectus muscle.  The peritoneal flap was closed with the same device.  There were no peritoneal defects or exposed mesh at the conclusion.  The umbilical trocar was removed and the fascial defect was closed with a 0 Vicryl suture.  The peritoneal cavity was completely desufflated, the trocars removed and the skin closed with 4-0 Monocryl subcuticular suture and skin glue.  All sponge and needle counts  were correct at the end of the case.  Ivar Drape, MD General, Bariatric, & Minimally Invasive Surgery [facility], [place]

## 2023-06-08 NOTE — H&P (Signed)
 Admitting Physician: Hyman Hopes Sindi Beckworth  Service: Hernia  CC: Hernia  Subjective   HPI: Peter Reynolds is an 89 y.o. male who is here for hernia repair  Past Medical History:  Diagnosis Date   CAD    s/p CABG   Diabetes mellitus    Dyslipidemia    Essential hypertension, benign    Hematuria    Presence of permanent cardiac pacemaker    RBBB    Sick sinus syndrome [facility])    s/p PPM (MDT) by Dr Reyes Ivan 2008    Past Surgical History:  Procedure Laterality Date   CATARACT EXTRACTION, BILATERAL     Coronary artery bypass grafting.     lima-lad,svg-OM1-2,svg-rca   Dual-chamber permanent pacemaker implant 2008  06/23/2006   MDT Adapta implanted by Dr Reyes Ivan   EP IMPLANTABLE DEVICE N/A 10/15/2015   Procedure:  PPM Generator Changeout;  Surgeon: Hillis Range, MD;  Location: [facility] INVASIVE CV LAB;  Service: Cardiovascular;  Laterality: N/A;   HAND SURGERY Bilateral 04/06/2002   2004/2005   HERNIA REPAIR  04/06/1993   right    Family History  Problem Relation Age of Onset   Hypertension Mother    Heart disease Father    Heart disease Brother     Social:  reports that he has never smoked. He has never used smokeless tobacco. He reports that he does not drink alcohol and does not use drugs.  Allergies: No Known Allergies  Medications: Current Outpatient Medications  Medication Instructions   diltiazem (TIAZAC) 360 mg, Oral, Every morning   dorzolamide-timolol (COSOPT) 2-0.5 % ophthalmic solution 1 drop, 2 times daily   Eliquis 5 mg, Oral, 2 times daily   lisinopril-hydrochlorothiazide (PRINZIDE,ZESTORETIC) 20-12.5 MG per tablet 1 tablet, Every morning   metFORMIN (GLUCOPHAGE-XR) 1,000 mg, 2 times daily with meals   Multiple Vitamin (MULTI-VITAMIN PO) 1 tablet, Every morning   nitroGLYCERIN (NITROSTAT) 0.4 mg, Every 5 min PRN   pravastatin (PRAVACHOL) 40 mg, Daily at bedtime   silodosin (RAPAFLO) 4 mg, Daily at bedtime   Vitamin D3 5,000 Units, Daily    vitamin E 400 Units, Every morning   zinc gluconate 50 mg, Every morning    ROS - all of the below systems have been reviewed with the patient and positives are indicated with bold text General: chills, fever or night sweats Eyes: blurry vision or double vision ENT: epistaxis or sore throat Allergy/Immunology: itchy/watery eyes or nasal congestion Hematologic/Lymphatic: bleeding problems, blood clots or swollen lymph nodes Endocrine: temperature intolerance or unexpected weight changes Breast: new or changing breast lumps or nipple discharge Resp: cough, shortness of breath, or wheezing CV: chest pain or dyspnea on exertion GI: as per HPI GU: dysuria, trouble voiding, or hematuria MSK: joint pain or joint stiffness Neuro: TIA or stroke symptoms Derm: pruritus and skin lesion changes Psych: anxiety and depression  Objective   PE Blood pressure (!) 142/92, pulse 82, temperature 97.9 F (36.6 C), temperature source Oral, resp. rate 16, SpO2 96%. Constitutional: NAD; conversant; no deformities Eyes: Moist conjunctiva; no lid lag; anicteric; PERRL Neck: Trachea midline; no thyromegaly Lungs: Normal respiratory effort; no tactile fremitus CV: RRR; no palpable thrills; no pitting edema GI: Abd Left inguinal hernia; no palpable hepatosplenomegaly MSK: Normal range of motion of extremities; no clubbing/cyanosis Psychiatric: Appropriate affect; alert and oriented x3 Lymphatic: No palpable cervical or axillary lymphadenopathy  Results for orders placed or performed during the hospital encounter of 06/08/23 (from the past 24 hours)  Glucose, capillary  Status: Abnormal   Collection Time: 06/08/23 11:44 AM  Result Value Ref Range   Glucose-Capillary 148 (H) 70 - 99 mg/dL   Comment 1 Notify RN    Comment 2 Document in Chart     Imaging Orders  No imaging studies ordered today  CT abd/pel 07/24/2011  No evidence for kidney stones and no suspicious renal lesions. There is a small  low density structure in the left kidney upper pole which probably represents a cyst.  Mild urinary bladder wall thickening and mild trabeculation of the urinary bladder wall. Findings may be related to the enlarged prostate gland.  Scattered sub centimeter low density structures within the liver are likely an incidental finding as described.  On my review of the CT scan from 12 years ago, there is a small fat containing left inguinal hernia     Assessment and Plan   Peter Reynolds is an 89 y.o. male with a left inguinal Hernia Left-sided inguinal hernia, symptomatic with pain and discomfort during activities. No nausea or abnormal bowel movements. Previous right-sided hernia repair in 1995. Hernia identified on a 2013 CT scan. Discussed risks of untreated hernia, including rare complications like intestine entrapment and infection. Patient prefers surgical repair due to pain and activity limitations. Recommended laparoscopic left inguinal hernia with mesh. We discussed the procedure, its risks, benefits and alternatives. After a full discussion and all questions answered the patient granted consent to proceed.  Quentin Ore, MD  [facility] Use [email or web address] to contact on call provider

## 2023-06-08 NOTE — Anesthesia Procedure Notes (Signed)
 Procedure Name: Intubation Date/Time: 06/08/2023 3:17 PM  Performed by: Jamelle Rushing, CRNAPre-anesthesia Checklist: Patient identified, Emergency Drugs available, Suction available, Patient being monitored and Timeout performed Patient Re-evaluated:Patient Re-evaluated prior to induction Oxygen Delivery Method: Circle system utilized Preoxygenation: Pre-oxygenation with 100% oxygen Induction Type: IV induction Ventilation: Mask ventilation without difficulty Laryngoscope Size: Mac and 3 Grade View: Grade I Tube type: Oral Tube size: 8.0 mm Number of attempts: 1 Airway Equipment and Method: Stylet Placement Confirmation: ETT inserted through vocal cords under direct vision, positive ETCO2, CO2 detector and breath sounds checked- equal and bilateral Secured at: 22 cm Tube secured with: Tape Dental Injury: Teeth and Oropharynx as per pre-operative assessment

## 2023-06-09 ENCOUNTER — Encounter (HOSPITAL_COMMUNITY): Payer: Self-pay | Admitting: Surgery

## 2023-06-09 ENCOUNTER — Other Ambulatory Visit: Payer: Self-pay

## 2023-06-09 NOTE — Anesthesia Postprocedure Evaluation (Signed)
 Anesthesia Post Note  Patient: Peter Reynolds  Procedure(s) Performed: LAPAROSCOPIC LEFT INGUINAL HERNIA REPAIR WITH MESH (Left)     Patient location during evaluation: PACU Anesthesia Type: General Level of consciousness: awake and alert Pain management: pain level controlled Vital Signs Assessment: post-procedure vital signs reviewed and stable Respiratory status: spontaneous breathing, nonlabored ventilation, respiratory function stable and patient connected to nasal cannula oxygen Cardiovascular status: blood pressure returned to baseline and stable Postop Assessment: no apparent nausea or vomiting Anesthetic complications: no   No notable events documented.  Last Vitals:  Vitals:   06/08/23 1800 06/08/23 1830  BP: (!) 151/84 (!) 147/77  Pulse: 60 67  Resp: 10   Temp:  (!) 36.4 C  SpO2: 95% 94%    Last Pain:  Vitals:   06/08/23 1830  TempSrc:   PainSc: 3    Pain Goal:                   Makeba Delcastillo

## 2023-06-10 LAB — SURGICAL PATHOLOGY

## 2023-07-01 NOTE — Progress Notes (Signed)
 Remote pacemaker transmission.

## 2023-07-01 NOTE — Addendum Note (Signed)
 Addended by: Elease Etienne A on: 07/01/2023 02:02 PM   Modules accepted: Orders

## 2023-07-09 DIAGNOSIS — H402212 Chronic angle-closure glaucoma, right eye, moderate stage: Secondary | ICD-10-CM | POA: Diagnosis not present

## 2023-07-09 DIAGNOSIS — H402221 Chronic angle-closure glaucoma, left eye, mild stage: Secondary | ICD-10-CM | POA: Diagnosis not present

## 2023-07-26 DIAGNOSIS — I480 Paroxysmal atrial fibrillation: Secondary | ICD-10-CM | POA: Diagnosis not present

## 2023-07-26 DIAGNOSIS — E1151 Type 2 diabetes mellitus with diabetic peripheral angiopathy without gangrene: Secondary | ICD-10-CM | POA: Diagnosis not present

## 2023-07-26 DIAGNOSIS — D6869 Other thrombophilia: Secondary | ICD-10-CM | POA: Diagnosis not present

## 2023-07-26 DIAGNOSIS — C61 Malignant neoplasm of prostate: Secondary | ICD-10-CM | POA: Diagnosis not present

## 2023-07-26 DIAGNOSIS — Z950 Presence of cardiac pacemaker: Secondary | ICD-10-CM | POA: Diagnosis not present

## 2023-07-26 DIAGNOSIS — I10 Essential (primary) hypertension: Secondary | ICD-10-CM | POA: Diagnosis not present

## 2023-07-26 DIAGNOSIS — I2510 Atherosclerotic heart disease of native coronary artery without angina pectoris: Secondary | ICD-10-CM | POA: Diagnosis not present

## 2023-07-26 DIAGNOSIS — I739 Peripheral vascular disease, unspecified: Secondary | ICD-10-CM | POA: Diagnosis not present

## 2023-08-24 ENCOUNTER — Ambulatory Visit (INDEPENDENT_AMBULATORY_CARE_PROVIDER_SITE_OTHER): Payer: Medicare PPO

## 2023-08-24 DIAGNOSIS — I495 Sick sinus syndrome: Secondary | ICD-10-CM | POA: Diagnosis not present

## 2023-08-26 LAB — CUP PACEART REMOTE DEVICE CHECK
Battery Impedance: 788 Ohm
Battery Remaining Longevity: 71 mo
Battery Voltage: 2.78 V
Brady Statistic AP VP Percent: 1 %
Brady Statistic AP VS Percent: 88 %
Brady Statistic AS VP Percent: 0 %
Brady Statistic AS VS Percent: 11 %
Date Time Interrogation Session: 20250522100743
Implantable Lead Connection Status: 753985
Implantable Lead Connection Status: 753985
Implantable Lead Implant Date: 20080319
Implantable Lead Implant Date: 20080319
Implantable Lead Location: 753859
Implantable Lead Location: 753860
Implantable Lead Model: 5076
Implantable Lead Model: 5076
Implantable Pulse Generator Implant Date: 20170711
Lead Channel Impedance Value: 431 Ohm
Lead Channel Impedance Value: 596 Ohm
Lead Channel Pacing Threshold Amplitude: 0.75 V
Lead Channel Pacing Threshold Amplitude: 1 V
Lead Channel Pacing Threshold Pulse Width: 0.4 ms
Lead Channel Pacing Threshold Pulse Width: 0.4 ms
Lead Channel Setting Pacing Amplitude: 2 V
Lead Channel Setting Pacing Amplitude: 2.5 V
Lead Channel Setting Pacing Pulse Width: 0.4 ms
Lead Channel Setting Sensing Sensitivity: 2 mV
Zone Setting Status: 755011
Zone Setting Status: 755011

## 2023-09-02 ENCOUNTER — Ambulatory Visit: Payer: Self-pay | Admitting: Cardiovascular Disease

## 2023-10-04 DIAGNOSIS — N403 Nodular prostate with lower urinary tract symptoms: Secondary | ICD-10-CM | POA: Diagnosis not present

## 2023-10-04 DIAGNOSIS — R351 Nocturia: Secondary | ICD-10-CM | POA: Diagnosis not present

## 2023-10-04 DIAGNOSIS — N3941 Urge incontinence: Secondary | ICD-10-CM | POA: Diagnosis not present

## 2023-10-04 DIAGNOSIS — C61 Malignant neoplasm of prostate: Secondary | ICD-10-CM | POA: Diagnosis not present

## 2023-10-19 NOTE — Addendum Note (Signed)
 Addended by: VICCI SELLER A on: 10/19/2023 10:25 AM   Modules accepted: Orders

## 2023-10-19 NOTE — Progress Notes (Signed)
 Remote pacemaker transmission.

## 2023-11-17 DIAGNOSIS — L812 Freckles: Secondary | ICD-10-CM | POA: Diagnosis not present

## 2023-11-17 DIAGNOSIS — D1801 Hemangioma of skin and subcutaneous tissue: Secondary | ICD-10-CM | POA: Diagnosis not present

## 2023-11-17 DIAGNOSIS — Z85828 Personal history of other malignant neoplasm of skin: Secondary | ICD-10-CM | POA: Diagnosis not present

## 2023-11-17 DIAGNOSIS — L821 Other seborrheic keratosis: Secondary | ICD-10-CM | POA: Diagnosis not present

## 2024-01-13 DIAGNOSIS — H524 Presbyopia: Secondary | ICD-10-CM | POA: Diagnosis not present

## 2024-01-13 DIAGNOSIS — E119 Type 2 diabetes mellitus without complications: Secondary | ICD-10-CM | POA: Diagnosis not present

## 2024-01-13 DIAGNOSIS — H402221 Chronic angle-closure glaucoma, left eye, mild stage: Secondary | ICD-10-CM | POA: Diagnosis not present

## 2024-01-13 DIAGNOSIS — H402212 Chronic angle-closure glaucoma, right eye, moderate stage: Secondary | ICD-10-CM | POA: Diagnosis not present

## 2024-01-19 DIAGNOSIS — C61 Malignant neoplasm of prostate: Secondary | ICD-10-CM | POA: Diagnosis not present

## 2024-01-26 DIAGNOSIS — R399 Unspecified symptoms and signs involving the genitourinary system: Secondary | ICD-10-CM | POA: Diagnosis not present

## 2024-01-26 DIAGNOSIS — R3915 Urgency of urination: Secondary | ICD-10-CM | POA: Diagnosis not present

## 2024-01-26 DIAGNOSIS — R351 Nocturia: Secondary | ICD-10-CM | POA: Diagnosis not present

## 2024-01-26 DIAGNOSIS — C61 Malignant neoplasm of prostate: Secondary | ICD-10-CM | POA: Diagnosis not present

## 2024-01-26 DIAGNOSIS — N401 Enlarged prostate with lower urinary tract symptoms: Secondary | ICD-10-CM | POA: Diagnosis not present

## 2024-02-05 DIAGNOSIS — E1122 Type 2 diabetes mellitus with diabetic chronic kidney disease: Secondary | ICD-10-CM | POA: Diagnosis not present

## 2024-02-05 DIAGNOSIS — I4892 Unspecified atrial flutter: Secondary | ICD-10-CM | POA: Diagnosis not present

## 2024-02-05 DIAGNOSIS — I509 Heart failure, unspecified: Secondary | ICD-10-CM | POA: Diagnosis not present

## 2024-02-05 DIAGNOSIS — I4891 Unspecified atrial fibrillation: Secondary | ICD-10-CM | POA: Diagnosis not present

## 2024-02-05 DIAGNOSIS — I495 Sick sinus syndrome: Secondary | ICD-10-CM | POA: Diagnosis not present

## 2024-02-05 DIAGNOSIS — N529 Male erectile dysfunction, unspecified: Secondary | ICD-10-CM | POA: Diagnosis not present

## 2024-02-05 DIAGNOSIS — E785 Hyperlipidemia, unspecified: Secondary | ICD-10-CM | POA: Diagnosis not present

## 2024-02-05 DIAGNOSIS — D6869 Other thrombophilia: Secondary | ICD-10-CM | POA: Diagnosis not present

## 2024-02-05 DIAGNOSIS — I130 Hypertensive heart and chronic kidney disease with heart failure and stage 1 through stage 4 chronic kidney disease, or unspecified chronic kidney disease: Secondary | ICD-10-CM | POA: Diagnosis not present

## 2024-02-21 DIAGNOSIS — E785 Hyperlipidemia, unspecified: Secondary | ICD-10-CM | POA: Diagnosis not present

## 2024-02-21 DIAGNOSIS — E1151 Type 2 diabetes mellitus with diabetic peripheral angiopathy without gangrene: Secondary | ICD-10-CM | POA: Diagnosis not present

## 2024-02-21 DIAGNOSIS — R82998 Other abnormal findings in urine: Secondary | ICD-10-CM | POA: Diagnosis not present

## 2024-02-21 DIAGNOSIS — Z1339 Encounter for screening examination for other mental health and behavioral disorders: Secondary | ICD-10-CM | POA: Diagnosis not present

## 2024-02-21 DIAGNOSIS — I480 Paroxysmal atrial fibrillation: Secondary | ICD-10-CM | POA: Diagnosis not present

## 2024-02-21 DIAGNOSIS — Z0000 Encounter for general adult medical examination without abnormal findings: Secondary | ICD-10-CM | POA: Diagnosis not present

## 2024-02-21 DIAGNOSIS — I10 Essential (primary) hypertension: Secondary | ICD-10-CM | POA: Diagnosis not present

## 2024-02-21 DIAGNOSIS — C61 Malignant neoplasm of prostate: Secondary | ICD-10-CM | POA: Diagnosis not present

## 2024-02-21 DIAGNOSIS — I2510 Atherosclerotic heart disease of native coronary artery without angina pectoris: Secondary | ICD-10-CM | POA: Diagnosis not present

## 2024-02-21 DIAGNOSIS — Z1331 Encounter for screening for depression: Secondary | ICD-10-CM | POA: Diagnosis not present

## 2024-02-21 DIAGNOSIS — D6869 Other thrombophilia: Secondary | ICD-10-CM | POA: Diagnosis not present

## 2024-02-21 DIAGNOSIS — I739 Peripheral vascular disease, unspecified: Secondary | ICD-10-CM | POA: Diagnosis not present

## 2024-02-22 ENCOUNTER — Ambulatory Visit: Payer: Medicare PPO

## 2024-02-22 DIAGNOSIS — I495 Sick sinus syndrome: Secondary | ICD-10-CM | POA: Diagnosis not present

## 2024-02-23 DIAGNOSIS — R351 Nocturia: Secondary | ICD-10-CM | POA: Diagnosis not present

## 2024-02-23 DIAGNOSIS — N401 Enlarged prostate with lower urinary tract symptoms: Secondary | ICD-10-CM | POA: Diagnosis not present

## 2024-02-23 DIAGNOSIS — R350 Frequency of micturition: Secondary | ICD-10-CM | POA: Diagnosis not present

## 2024-02-25 LAB — CUP PACEART REMOTE DEVICE CHECK
Battery Impedance: 972 Ohm
Battery Remaining Longevity: 62 mo
Battery Voltage: 2.77 V
Brady Statistic AP VP Percent: 1 %
Brady Statistic AP VS Percent: 91 %
Brady Statistic AS VP Percent: 0 %
Brady Statistic AS VS Percent: 8 %
Date Time Interrogation Session: 20251121100731
Implantable Lead Connection Status: 753985
Implantable Lead Connection Status: 753985
Implantable Lead Implant Date: 20080319
Implantable Lead Implant Date: 20080319
Implantable Lead Location: 753859
Implantable Lead Location: 753860
Implantable Lead Model: 5076
Implantable Lead Model: 5076
Implantable Pulse Generator Implant Date: 20170711
Lead Channel Impedance Value: 437 Ohm
Lead Channel Impedance Value: 536 Ohm
Lead Channel Pacing Threshold Amplitude: 0.75 V
Lead Channel Pacing Threshold Amplitude: 0.875 V
Lead Channel Pacing Threshold Pulse Width: 0.4 ms
Lead Channel Pacing Threshold Pulse Width: 0.4 ms
Lead Channel Setting Pacing Amplitude: 2 V
Lead Channel Setting Pacing Amplitude: 2.5 V
Lead Channel Setting Pacing Pulse Width: 0.4 ms
Lead Channel Setting Sensing Sensitivity: 2 mV
Zone Setting Status: 755011
Zone Setting Status: 755011

## 2024-02-25 NOTE — Progress Notes (Signed)
 Remote PPM Transmission

## 2024-02-29 DIAGNOSIS — H402221 Chronic angle-closure glaucoma, left eye, mild stage: Secondary | ICD-10-CM | POA: Diagnosis not present

## 2024-03-06 ENCOUNTER — Ambulatory Visit: Payer: Self-pay | Admitting: Cardiovascular Disease

## 2024-03-07 DIAGNOSIS — H402212 Chronic angle-closure glaucoma, right eye, moderate stage: Secondary | ICD-10-CM | POA: Diagnosis not present

## 2024-04-19 ENCOUNTER — Telehealth (HOSPITAL_BASED_OUTPATIENT_CLINIC_OR_DEPARTMENT_OTHER): Payer: Self-pay | Admitting: *Deleted

## 2024-04-19 NOTE — Telephone Encounter (Signed)
 Line busy, pt needs in office appt, last seen 01/2023.

## 2024-04-19 NOTE — Telephone Encounter (Signed)
" ° °  Name: Peter Reynolds  DOB: 1928-06-08  MRN: [id number]  Primary Cardiologist: Peter Jordan, MD  Chart reviewed as part of pre-operative protocol coverage. Patient has not been seen in our office since 01/2023. Because of Peter Reynolds's past medical history and time since last visit, he will require a follow-up in-office visit in order to better assess preoperative cardiovascular risk.  Pre-op covering staff: - Please schedule appointment and call patient to inform them. If patient already had an upcoming appointment within acceptable timeframe, please add pre-op clearance to the appointment notes so provider is aware. - Please contact requesting surgeon's office via preferred method (i.e, phone, fax) to inform them of need for appointment prior to surgery.  This message will also be routed to pharmacy pool  for input on holding Eliquis  as requested below so that this information is available to the clearing provider at time of patient's appointment. Pharmacy, I know we typically do not hold Eliquis  for one simple tooth extraction? But do we hold for surgical extraction?  Jese Comella E Jaegar Croft, PA-C  04/19/2024, 12:04 PM   "

## 2024-04-19 NOTE — Telephone Encounter (Signed)
"  ° °  Pre-operative Risk Assessment    Patient Name: Peter Reynolds  DOB: [birth year] MRN: [id number]   Date of last office visit: 02/03/23 GLENDIA FERRIER, PAC Date of next office visit: NONE  Request for Surgical Clearance    Procedure:  Dental Extraction - Amount of Teeth to be Pulled:  1 TOOTH FOR SURGICAL EXTRACTION (TOOTH#5)  Date of Surgery:  Clearance TBD                                Surgeon:  DR. ELSIE BIHARI, DDS Surgeon's Group or Practice Name:  [facility]  Phone number:  [phone] Fax number:  [phone]; ATTN: MELANIE   Type of Clearance Requested:   - Medical  - Pharmacy:  Hold Apixaban  (Eliquis ) x 3-5 DAYS PER FORM   Type of Anesthesia:  Local    Additional requests/questions:    [name]   04/19/2024, 11:51 AM   "

## 2024-04-20 NOTE — Telephone Encounter (Signed)
 Patient with diagnosis of Afib on Eliquis  for anticoagulation.    Procedure: Dental Extraction - Amount of Teeth to be Pulled:  1 TOOTH FOR SURGICAL EXTRACTION (TOOTH#5)  Date of procedure: TBD   CHA2DS2-VASc Score = 5   This indicates a 7.2% annual risk of stroke. The patient's score is based upon: CHF History: 0 HTN History: 1 Diabetes History: 1 Stroke History: 0 Vascular Disease History: 1 Age Score: 2 Gender Score: 0   CrCl 54 mL/min  Platelet count 194 K   Patient has not had an Afib/aflutter ablation in the last 3 months, DCCV within the last 4 weeks or a watchman implanted in the last 45 days   Patient does not require pre-op antibiotics for dental procedure.  Per office protocol, patient can hold Eliquis   for 1 days prior to procedure.     **This guidance is not considered finalized until pre-operative APP has relayed final recommendations.**

## 2024-04-21 NOTE — Telephone Encounter (Signed)
 2nd attempt to reach the pt to schedule an appt in office. Pt last seen 2024.

## 2024-04-24 NOTE — Telephone Encounter (Signed)
 Pt has been schedule 05/04/24 Peter Bane, NP preop appt. Pt is aware of location for the appt.

## 2024-05-03 NOTE — Progress Notes (Unsigned)
" °  Cardiology Office Note   Date:  05/03/2024  ID:  Peter Reynolds, DOB May 03, 1928, MRN [id number] PCP: Yolande Toribio MATSU, MD  [facility] Providers Cardiologist:  Peter Jordan, MD Cardiology APP:  Vannie Reche RAMAN, NP  Electrophysiologist:  Eulas FORBES Furbish, MD { Click to update primary MD,subspecialty MD or APP then REFRESH:1}    PMH CAD s/p CABG in 1996 L-LAD, S-OM1/OM2, S-PDA Right bundle branch block PAF Sick sinus syndrome s/p PPM implant in 2008 Hypertension Hyperlipidemia  History of CABG in 1996 and pacemaker implant in 2008.  He is retired from [employer] and walks 1 mile daily for exercise.  He has been followed by Dr. Kelsie with electrophysiology and Dr. Jordan for general cardiology.  Lipids and BP have been well-controlled.   Last cardiology clinic visit was 02/03/2023 with Glendia Ferrier, PA.  He was continued on Eliquis  5 mg twice daily, pravastatin, diltiazem , and lisinopril-hydrochlorothiazide.  Triglycerides were mildly elevated and A1c was 8.7%.  He was encouraged to work on better diet.  History of Present Illness Peter Reynolds is a 89 y.o. male ***  ROS: ***  Studies Reviewed      ***  No results found for: LIPOA  Risk Assessment/Calculations  CHA2DS2-VASc Score = 5  {Confirm score is correct.  If not, click here to update score.  REFRESH note.  :1} This indicates a 7.2% annual risk of stroke. The patient's score is based upon: CHF History: 0 HTN History: 1 Diabetes History: 1 Stroke History: 0 Vascular Disease History: 1 Age Score: 2 Gender Score: 0   {This patient has a significant risk of stroke if diagnosed with atrial fibrillation.  Please consider VKA or DOAC agent for anticoagulation if the bleeding risk is acceptable.   You can also use the SmartPhrase .[email or web address] for documentation.   :[id number]} No BP recorded.  {Refresh Note OR Click here to enter BP  :1}***       Physical Exam VS:  There were  no vitals taken for this visit.   Wt Readings from Last 3 Encounters:  06/08/23 158 lb (71.7 kg)  05/25/23 158 lb (71.7 kg)  02/03/23 167 lb (75.8 kg)    GEN: Well nourished, well developed in no acute distress NECK: No JVD; No carotid bruits CARDIAC: ***RRR, no murmurs, rubs, gallops RESPIRATORY:  Clear to auscultation without rales, wheezing or rhonchi  ABDOMEN: Soft, non-tender, non-distended EXTREMITIES:  No edema; No deformity   ASSESSMENT AND PLAN ***    {Are you ordering a CV Procedure (e.g. stress test, cath, DCCV, TEE, etc)?   Press F2        :[id number]}  Dispo: ***  Signed, Rosaline Bane, NP-C "

## 2024-05-04 ENCOUNTER — Ambulatory Visit (HOSPITAL_BASED_OUTPATIENT_CLINIC_OR_DEPARTMENT_OTHER): Admitting: Nurse Practitioner

## 2024-05-04 NOTE — Telephone Encounter (Signed)
 Per Kari with our scheduling team sent secure chat to preop . Pt changed his appt to 06/09/24 with Josefa Beauvais, [facility] and cancelled the 05/04/24 qppt with Rosaline Bane, NP

## 2024-06-09 ENCOUNTER — Ambulatory Visit: Admitting: General Practice
